# Patient Record
Sex: Male | Born: 1950 | Race: Black or African American | Hispanic: No | Marital: Married | Smoking: Never smoker
Health system: Southern US, Community
[De-identification: ages and names within clinical notes are randomized; demographics above are authoritative.]

## PROBLEM LIST (undated history)

## (undated) DIAGNOSIS — I2510 Atherosclerotic heart disease of native coronary artery without angina pectoris: Secondary | ICD-10-CM

## (undated) DIAGNOSIS — I10 Essential (primary) hypertension: Secondary | ICD-10-CM

## (undated) DIAGNOSIS — E785 Hyperlipidemia, unspecified: Secondary | ICD-10-CM

## (undated) DIAGNOSIS — I214 Non-ST elevation (NSTEMI) myocardial infarction: Secondary | ICD-10-CM

## (undated) HISTORY — DX: Non-ST elevation (NSTEMI) myocardial infarction: I21.4

## (undated) HISTORY — DX: Essential (primary) hypertension: I10

## (undated) HISTORY — DX: Atherosclerotic heart disease of native coronary artery without angina pectoris: I25.10

## (undated) HISTORY — DX: Hyperlipidemia, unspecified: E78.5

---

## 1997-06-24 ENCOUNTER — Other Ambulatory Visit: Admission: RE | Admit: 1997-06-24 | Discharge: 1997-06-24 | Payer: Self-pay | Admitting: *Deleted

## 2000-03-26 ENCOUNTER — Ambulatory Visit (HOSPITAL_COMMUNITY): Admission: RE | Admit: 2000-03-26 | Discharge: 2000-03-26 | Payer: Self-pay | Admitting: Gastroenterology

## 2005-12-17 ENCOUNTER — Emergency Department (HOSPITAL_COMMUNITY): Admission: EM | Admit: 2005-12-17 | Discharge: 2005-12-18 | Payer: Self-pay | Admitting: Emergency Medicine

## 2014-10-09 ENCOUNTER — Emergency Department (HOSPITAL_COMMUNITY)
Admission: EM | Admit: 2014-10-09 | Discharge: 2014-10-10 | Disposition: A | Discharge status: Home / Self Care | Payer: Self-pay | Attending: Emergency Medicine | Admitting: Emergency Medicine

## 2014-10-09 ENCOUNTER — Encounter (HOSPITAL_COMMUNITY): Payer: Self-pay | Admitting: Emergency Medicine

## 2014-10-09 DIAGNOSIS — Y9389 Activity, other specified: Secondary | ICD-10-CM | POA: Insufficient documentation

## 2014-10-09 DIAGNOSIS — S199XXA Unspecified injury of neck, initial encounter: Secondary | ICD-10-CM | POA: Insufficient documentation

## 2014-10-09 DIAGNOSIS — Y92410 Unspecified street and highway as the place of occurrence of the external cause: Secondary | ICD-10-CM | POA: Insufficient documentation

## 2014-10-09 DIAGNOSIS — Y999 Unspecified external cause status: Secondary | ICD-10-CM | POA: Insufficient documentation

## 2014-10-09 DIAGNOSIS — M542 Cervicalgia: Secondary | ICD-10-CM

## 2014-10-09 DIAGNOSIS — I10 Essential (primary) hypertension: Secondary | ICD-10-CM | POA: Insufficient documentation

## 2014-10-09 HISTORY — DX: Essential (primary) hypertension: I10
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
## 2014-10-10 ENCOUNTER — Emergency Department (HOSPITAL_COMMUNITY): Payer: Self-pay

## 2014-10-10 MED ORDER — IBUPROFEN 600 MG PO TABS: 600.0000 mg | ORAL_TABLET | Freq: Three times a day (TID) | ORAL | Status: DC | PRN

## 2014-10-10 MED ORDER — IBUPROFEN 400 MG PO TABS
600.0000 mg | ORAL_TABLET | Freq: Four times a day (QID) | ORAL | Status: DC | PRN
  Administered 2014-10-10: 600 mg via ORAL
  Filled 2014-10-10 (×2): qty 1

## 2014-10-10 MED ORDER — CYCLOBENZAPRINE HCL 10 MG PO TABS: 10.0000 mg | ORAL_TABLET | Freq: Three times a day (TID) | ORAL | Status: DC | PRN

## 2014-10-10 NOTE — ED Notes (Signed)
Pt. Ambulated with no cp/sob/dizziness. Pt states 4/10 pain in the back of neck. No other pain noted. C-collar removed.
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
## 2015-12-29 ENCOUNTER — Other Ambulatory Visit: Payer: Self-pay | Admitting: Family Medicine

## 2015-12-29 ENCOUNTER — Ambulatory Visit
Admission: RE | Admit: 2015-12-29 | Discharge: 2015-12-29 | Disposition: A | Discharge status: Home / Self Care | Payer: 59 | Source: Ambulatory Visit | Attending: Family Medicine | Admitting: Family Medicine

## 2015-12-29 DIAGNOSIS — M542 Cervicalgia: Secondary | ICD-10-CM

## 2015-12-29 DIAGNOSIS — M549 Dorsalgia, unspecified: Secondary | ICD-10-CM

## 2016-11-03 ENCOUNTER — Emergency Department (HOSPITAL_COMMUNITY): Payer: Commercial Managed Care - PPO

## 2016-11-03 ENCOUNTER — Encounter (HOSPITAL_COMMUNITY): Payer: Self-pay

## 2016-11-03 ENCOUNTER — Inpatient Hospital Stay (HOSPITAL_COMMUNITY)
Admission: EM | Admit: 2016-11-03 | Discharge: 2016-11-06 | DRG: 247 | Disposition: A | Discharge status: Home / Self Care | Payer: Commercial Managed Care - PPO | Attending: Cardiovascular Disease | Admitting: Cardiovascular Disease

## 2016-11-03 ENCOUNTER — Encounter (HOSPITAL_COMMUNITY)
Admission: EM | Disposition: A | Discharge status: Home / Self Care | Payer: Self-pay | Source: Home / Self Care | Attending: Cardiovascular Disease

## 2016-11-03 ENCOUNTER — Other Ambulatory Visit: Payer: Self-pay

## 2016-11-03 DIAGNOSIS — Z955 Presence of coronary angioplasty implant and graft: Secondary | ICD-10-CM

## 2016-11-03 DIAGNOSIS — R001 Bradycardia, unspecified: Secondary | ICD-10-CM | POA: Diagnosis present

## 2016-11-03 DIAGNOSIS — I351 Nonrheumatic aortic (valve) insufficiency: Secondary | ICD-10-CM | POA: Diagnosis not present

## 2016-11-03 DIAGNOSIS — I252 Old myocardial infarction: Secondary | ICD-10-CM | POA: Diagnosis present

## 2016-11-03 DIAGNOSIS — I25119 Atherosclerotic heart disease of native coronary artery with unspecified angina pectoris: Secondary | ICD-10-CM

## 2016-11-03 DIAGNOSIS — I2582 Chronic total occlusion of coronary artery: Secondary | ICD-10-CM | POA: Diagnosis present

## 2016-11-03 DIAGNOSIS — I10 Essential (primary) hypertension: Secondary | ICD-10-CM | POA: Diagnosis present

## 2016-11-03 DIAGNOSIS — I2510 Atherosclerotic heart disease of native coronary artery without angina pectoris: Secondary | ICD-10-CM | POA: Diagnosis present

## 2016-11-03 DIAGNOSIS — I214 Non-ST elevation (NSTEMI) myocardial infarction: Secondary | ICD-10-CM

## 2016-11-03 DIAGNOSIS — E785 Hyperlipidemia, unspecified: Secondary | ICD-10-CM | POA: Diagnosis present

## 2016-11-03 DIAGNOSIS — Z7982 Long term (current) use of aspirin: Secondary | ICD-10-CM | POA: Diagnosis not present

## 2016-11-03 HISTORY — PX: LEFT HEART CATH AND CORONARY ANGIOGRAPHY: CATH118249

## 2016-11-03 HISTORY — DX: Non-ST elevation (NSTEMI) myocardial infarction: I21.4

## 2016-11-03 HISTORY — PX: CORONARY STENT INTERVENTION: CATH118234

## 2016-11-03 LAB — CBC
HCT: 36.6 % — ABNORMAL LOW (ref 39.0–52.0)
HCT: 38.3 % — ABNORMAL LOW (ref 39.0–52.0)
HEMOGLOBIN: 13.2 g/dL (ref 13.0–17.0)
Hemoglobin: 12.4 g/dL — ABNORMAL LOW (ref 13.0–17.0)
MCH: 29.2 pg (ref 26.0–34.0)
MCH: 29.4 pg (ref 26.0–34.0)
MCHC: 33.9 g/dL (ref 30.0–36.0)
MCHC: 34.5 g/dL (ref 30.0–36.0)
MCV: 85.3 fL (ref 78.0–100.0)
MCV: 86.1 fL (ref 78.0–100.0)
PLATELETS: 138 10*3/uL — AB (ref 150–400)
Platelets: 150 10*3/uL (ref 150–400)
RBC: 4.25 MIL/uL (ref 4.22–5.81)
RBC: 4.49 MIL/uL (ref 4.22–5.81)
RDW: 13.4 % (ref 11.5–15.5)
RDW: 13.9 % (ref 11.5–15.5)
WBC: 5.2 10*3/uL (ref 4.0–10.5)
WBC: 5.5 10*3/uL (ref 4.0–10.5)

## 2016-11-03 LAB — BASIC METABOLIC PANEL
ANION GAP: 6 (ref 5–15)
BUN: 16 mg/dL (ref 6–20)
CALCIUM: 9.3 mg/dL (ref 8.9–10.3)
CO2: 27 mmol/L (ref 22–32)
Chloride: 103 mmol/L (ref 101–111)
Creatinine, Ser: 1.24 mg/dL (ref 0.61–1.24)
GFR calc Af Amer: >60 mL/min (ref >60)
GFR, EST NON AFRICAN AMERICAN: 59 mL/min — AB (ref >60)
GLUCOSE: 72 mg/dL (ref 65–99)
POTASSIUM: 3.6 mmol/L (ref 3.5–5.1)
SODIUM: 136 mmol/L (ref 135–145)

## 2016-11-03 LAB — I-STAT TROPONIN, ED
TROPONIN I, POC: 0.05 ng/mL (ref 0.00–0.08)
TROPONIN I, POC: 0.46 ng/mL — AB (ref 0.00–0.08)

## 2016-11-03 LAB — TROPONIN I: Troponin I: 55.59 ng/mL (ref <0.03)

## 2016-11-03 LAB — MRSA PCR SCREENING: MRSA by PCR: NEGATIVE

## 2016-11-03 SURGERY — LEFT HEART CATH AND CORONARY ANGIOGRAPHY
Anesthesia: LOCAL

## 2016-11-03 MED ORDER — HEPARIN BOLUS VIA INFUSION
4000.0000 [IU] | Freq: Once | INTRAVENOUS | Status: AC
  Administered 2016-11-03: 4000 [IU] via INTRAVENOUS
  Filled 2016-11-03: qty 4000

## 2016-11-03 MED ORDER — SODIUM CHLORIDE 0.9% FLUSH: 3.0000 mL | INTRAVENOUS | Status: DC | PRN

## 2016-11-03 MED ORDER — TICAGRELOR 90 MG PO TABS
ORAL_TABLET | ORAL | Status: AC
  Filled 2016-11-03: qty 2

## 2016-11-03 MED ORDER — ONDANSETRON HCL 4 MG/2ML IJ SOLN: 4.0000 mg | Freq: Four times a day (QID) | INTRAMUSCULAR | Status: DC | PRN

## 2016-11-03 MED ORDER — FENTANYL CITRATE (PF) 100 MCG/2ML IJ SOLN
INTRAMUSCULAR | Status: DC | PRN
  Administered 2016-11-03: 25 ug via INTRAVENOUS

## 2016-11-03 MED ORDER — MORPHINE SULFATE (PF) 4 MG/ML IV SOLN
4.0000 mg | Freq: Once | INTRAVENOUS | Status: AC
  Administered 2016-11-03: 4 mg via INTRAVENOUS
  Filled 2016-11-03: qty 1

## 2016-11-03 MED ORDER — MIDAZOLAM HCL 2 MG/2ML IJ SOLN
INTRAMUSCULAR | Status: DC | PRN
  Administered 2016-11-03: 2 mg via INTRAVENOUS
  Administered 2016-11-03: 1 mg via INTRAVENOUS

## 2016-11-03 MED ORDER — NITROGLYCERIN 1 MG/10 ML FOR IR/CATH LAB
INTRA_ARTERIAL | Status: AC
  Filled 2016-11-03: qty 10

## 2016-11-03 MED ORDER — IOPAMIDOL (ISOVUE-370) INJECTION 76%
INTRAVENOUS | Status: DC | PRN
  Administered 2016-11-03: 295 mL via INTRA_ARTERIAL

## 2016-11-03 MED ORDER — METOPROLOL TARTRATE 12.5 MG HALF TABLET
12.5000 mg | ORAL_TABLET | Freq: Two times a day (BID) | ORAL | Status: DC
Start: 2016-11-03 — End: 2016-11-03

## 2016-11-03 MED ORDER — VERAPAMIL HCL 2.5 MG/ML IV SOLN
INTRAVENOUS | Status: DC | PRN
  Administered 2016-11-03: 08:00:00 via INTRA_ARTERIAL

## 2016-11-03 MED ORDER — IOPAMIDOL (ISOVUE-370) INJECTION 76%
INTRAVENOUS | Status: AC
  Filled 2016-11-03: qty 50

## 2016-11-03 MED ORDER — VERAPAMIL HCL 2.5 MG/ML IV SOLN
INTRAVENOUS | Status: AC
  Filled 2016-11-03: qty 2

## 2016-11-03 MED ORDER — MIDAZOLAM HCL 2 MG/2ML IJ SOLN
INTRAMUSCULAR | Status: AC
  Filled 2016-11-03: qty 2

## 2016-11-03 MED ORDER — ACETAMINOPHEN 325 MG PO TABS
650.0000 mg | ORAL_TABLET | ORAL | Status: DC | PRN
  Filled 2016-11-03: qty 2

## 2016-11-03 MED ORDER — LABETALOL HCL 5 MG/ML IV SOLN: 10.0000 mg | INTRAVENOUS | Status: AC | PRN

## 2016-11-03 MED ORDER — SODIUM CHLORIDE 0.9 % WEIGHT BASED INFUSION
1.0000 mL/kg/h | INTRAVENOUS | Status: AC
  Administered 2016-11-03: 1 mL/kg/h via INTRAVENOUS

## 2016-11-03 MED ORDER — OXYCODONE HCL 5 MG PO TABS
5.0000 mg | ORAL_TABLET | ORAL | Status: DC | PRN
  Filled 2016-11-03: qty 1

## 2016-11-03 MED ORDER — HEPARIN (PORCINE) IN NACL 2-0.9 UNIT/ML-% IJ SOLN
INTRAMUSCULAR | Status: AC | PRN
  Administered 2016-11-03: 1000 mL

## 2016-11-03 MED ORDER — TICAGRELOR 90 MG PO TABS
ORAL_TABLET | ORAL | Status: DC | PRN
  Administered 2016-11-03: 180 mg via ORAL

## 2016-11-03 MED ORDER — HEPARIN SODIUM (PORCINE) 1000 UNIT/ML IJ SOLN
INTRAMUSCULAR | Status: DC | PRN
  Administered 2016-11-03 (×2): 3000 [IU] via INTRAVENOUS
  Administered 2016-11-03: 4000 [IU] via INTRAVENOUS
  Administered 2016-11-03: 8000 [IU] via INTRAVENOUS

## 2016-11-03 MED ORDER — HEPARIN (PORCINE) IN NACL 2-0.9 UNIT/ML-% IJ SOLN
INTRAMUSCULAR | Status: AC
Start: 2016-11-03 — End: 2016-11-03
  Filled 2016-11-03: qty 500

## 2016-11-03 MED ORDER — HEPARIN SODIUM (PORCINE) 1000 UNIT/ML IJ SOLN
INTRAMUSCULAR | Status: AC
  Filled 2016-11-03: qty 1

## 2016-11-03 MED ORDER — NITROGLYCERIN IN D5W 200-5 MCG/ML-% IV SOLN
0.0000 ug/min | Freq: Once | INTRAVENOUS | Status: AC
  Administered 2016-11-03: 5 ug/min via INTRAVENOUS
  Filled 2016-11-03: qty 250

## 2016-11-03 MED ORDER — LIDOCAINE HCL (PF) 1 % IJ SOLN
INTRAMUSCULAR | Status: AC
  Filled 2016-11-03: qty 30

## 2016-11-03 MED ORDER — HEPARIN (PORCINE) IN NACL 100-0.45 UNIT/ML-% IJ SOLN
1000.0000 [IU]/h | INTRAMUSCULAR | Status: DC
  Administered 2016-11-03: 1000 [IU]/h via INTRAVENOUS
  Filled 2016-11-03: qty 250

## 2016-11-03 MED ORDER — ASPIRIN EC 81 MG PO TBEC
81.0000 mg | DELAYED_RELEASE_TABLET | Freq: Every day | ORAL | Status: DC
  Administered 2016-11-04: 81 mg via ORAL
  Filled 2016-11-03: qty 1

## 2016-11-03 MED ORDER — IOPAMIDOL (ISOVUE-370) INJECTION 76%
INTRAVENOUS | Status: AC
  Filled 2016-11-03: qty 100

## 2016-11-03 MED ORDER — METOPROLOL TARTRATE 12.5 MG HALF TABLET
12.5000 mg | ORAL_TABLET | Freq: Two times a day (BID) | ORAL | Status: DC
  Administered 2016-11-04 – 2016-11-06 (×5): 12.5 mg via ORAL
  Filled 2016-11-03 (×5): qty 1

## 2016-11-03 MED ORDER — ASPIRIN 81 MG PO CHEW
324.0000 mg | CHEWABLE_TABLET | Freq: Once | ORAL | Status: AC
  Administered 2016-11-03: 324 mg via ORAL
  Filled 2016-11-03: qty 4

## 2016-11-03 MED ORDER — SODIUM CHLORIDE 0.9 % IV SOLN: 250.0000 mL | INTRAVENOUS | Status: DC | PRN

## 2016-11-03 MED ORDER — IOPAMIDOL (ISOVUE-370) INJECTION 76%
INTRAVENOUS | Status: AC
  Filled 2016-11-03: qty 125

## 2016-11-03 MED ORDER — NITROGLYCERIN 0.4 MG SL SUBL
0.4000 mg | SUBLINGUAL_TABLET | SUBLINGUAL | Status: DC | PRN
  Administered 2016-11-03: 0.4 mg via SUBLINGUAL
  Filled 2016-11-03: qty 1

## 2016-11-03 MED ORDER — LIDOCAINE HCL (PF) 1 % IJ SOLN
INTRAMUSCULAR | Status: DC | PRN
  Administered 2016-11-03: 2 mL

## 2016-11-03 MED ORDER — SODIUM CHLORIDE 0.9% FLUSH
3.0000 mL | Freq: Two times a day (BID) | INTRAVENOUS | Status: DC
  Administered 2016-11-03 – 2016-11-06 (×6): 3 mL via INTRAVENOUS

## 2016-11-03 MED ORDER — CYCLOBENZAPRINE HCL 10 MG PO TABS: 10.0000 mg | ORAL_TABLET | Freq: Three times a day (TID) | ORAL | Status: DC | PRN

## 2016-11-03 MED ORDER — NITROGLYCERIN 1 MG/10 ML FOR IR/CATH LAB
INTRA_ARTERIAL | Status: DC | PRN
  Administered 2016-11-03 (×2): 150 ug via INTRACORONARY
  Administered 2016-11-03: 150 ug

## 2016-11-03 MED ORDER — HEPARIN (PORCINE) IN NACL 2-0.9 UNIT/ML-% IJ SOLN
INTRAMUSCULAR | Status: AC
  Filled 2016-11-03: qty 500

## 2016-11-03 MED ORDER — FENTANYL CITRATE (PF) 100 MCG/2ML IJ SOLN
INTRAMUSCULAR | Status: AC
Start: 2016-11-03 — End: 2016-11-03
  Filled 2016-11-03: qty 2

## 2016-11-03 MED ORDER — TICAGRELOR 90 MG PO TABS
90.0000 mg | ORAL_TABLET | Freq: Two times a day (BID) | ORAL | Status: DC
  Administered 2016-11-03 – 2016-11-06 (×6): 90 mg via ORAL
  Filled 2016-11-03 (×6): qty 1

## 2016-11-03 MED ORDER — HYDRALAZINE HCL 20 MG/ML IJ SOLN: 5.0000 mg | INTRAMUSCULAR | Status: AC | PRN

## 2016-11-03 SURGICAL SUPPLY — 29 items
BALLN SAPPHIRE 2.0X12 (BALLOONS) ×6
BALLN SAPPHIRE ~~LOC~~ 2.25X12 (BALLOONS) ×2 IMPLANT
BALLN SAPPHIRE ~~LOC~~ 2.5X15 (BALLOONS) ×2 IMPLANT
BALLN ~~LOC~~ EMERGE MR 2.25X20 (BALLOONS) ×2
BALLN ~~LOC~~ EMERGE MR 2.5X20 (BALLOONS) ×2
BALLOON SAPPHIRE 2.0X12 (BALLOONS) ×3 IMPLANT
BALLOON ~~LOC~~ EMERGE MR 2.25X20 (BALLOONS) ×1 IMPLANT
BALLOON ~~LOC~~ EMERGE MR 2.5X20 (BALLOONS) ×1 IMPLANT
CATH EXPO 5FR FR4 (CATHETERS) ×2 IMPLANT
CATH INFINITI 5 FR 3DRC (CATHETERS) ×2 IMPLANT
CATH INFINITI 5 FR JL3.5 (CATHETERS) ×2 IMPLANT
CATH LAUNCHER 5F AL1 (CATHETERS) ×1 IMPLANT
CATH LAUNCHER 6FR EBU3.5 (CATHETERS) ×2 IMPLANT
CATH LAUNCHER 6FR JR4 (CATHETERS) ×2 IMPLANT
CATHETER LAUNCHER 5F AL1 (CATHETERS) ×2
DEVICE RAD COMP TR BAND LRG (VASCULAR PRODUCTS) ×2 IMPLANT
ELECT DEFIB PAD ADLT CADENCE (PAD) ×2 IMPLANT
GLIDESHEATH SLEND SS 6F .021 (SHEATH) ×2 IMPLANT
GUIDEWIRE INQWIRE 1.5J.035X260 (WIRE) ×2 IMPLANT
INQWIRE 1.5J .035X260CM (WIRE) ×4
KIT ENCORE 26 ADVANTAGE (KITS) ×2 IMPLANT
KIT HEART LEFT (KITS) ×2 IMPLANT
PACK CARDIAC CATHETERIZATION (CUSTOM PROCEDURE TRAY) ×2 IMPLANT
STENT SYNERGY DES 2.25X20 (Permanent Stent) ×2 IMPLANT
SYR MEDRAD MARK V 150ML (SYRINGE) ×2 IMPLANT
TRANSDUCER W/STOPCOCK (MISCELLANEOUS) ×2 IMPLANT
TUBING CIL FLEX 10 FLL-RA (TUBING) ×2 IMPLANT
WIRE ASAHI GRAND SLAM 180CM (WIRE) ×2 IMPLANT
WIRE COUGAR XT STRL 190CM (WIRE) ×4 IMPLANT
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
## 2016-11-03 NOTE — Interval H&P Note (Signed)
Cath Lab Visit (complete for each Cath Lab visit)  Clinical Evaluation Leading to the Procedure:   ACS: Yes.    Non-ACS:    Anginal Classification: CCS IV  Anti-ischemic medical therapy: Minimal Therapy (1 class of medications)  Non-Invasive Test Results: No non-invasive testing performed  Prior CABG: No previous CABG  Pt seen in ED. 4/10 chest pain overnight and currently. Abnormal but nondiagnotic EKG with mild troponin elevation. Agree urgent cath/PCI indicated. I have reviewed the risks, indications, and alternatives to cardiac catheterization, possible angioplasty, and stenting with the patient. Risks include but are not limited to bleeding, infection, vascular injury, stroke, myocardial infection, arrhythmia, kidney injury, radiation-related injury in the case of prolonged fluoroscopy use, emergency cardiac surgery, and death. The patient understands the risks of serious complication is 1-2 in 1000 with diagnostic cardiac cath and 1-2% or less with angioplasty/stenting.   History and Physical Interval Note:  11/03/2016 7:45 AM  Joshua Kirk  has presented today for surgery, with the diagnosis of STEMI  The various methods of treatment have been discussed with the patient and family. After consideration of risks, benefits and other options for treatment, the patient has consented to  Procedure(s): LEFT HEART CATH AND CORONARY ANGIOGRAPHY (N/A) as a surgical intervention .  The patient's history has been reviewed, patient examined, no change in status, stable for surgery.  I have reviewed the patient's chart and labs.  Questions were answered to the patient's satisfaction.     Tonny Bollman

## 2016-11-03 NOTE — ED Notes (Signed)
INTERVENTIONAL CARDIOLOGY  Asked to review clinical scenario by DR. Campos.: 66 yo with onset of central chest pain while dancing ~11:30 PM. Initial troponin was negative. IV morphine was given with reduction in pain. Delta troponin elevated at .46. Mild persisting pain with ECG showing STE V2 with inferior TWI without evolution over 5 hours. Hemodynamically stable. CXR with cardiomegaly.  Impression is ACS.  Plan escalation of GDMT including IV heparin, aspirin, IV NTG, high intensity statin and if unable to cool down will need coronary angiography. Please get general cardiology to see ASAP.

## 2016-11-03 NOTE — Progress Notes (Signed)
Patient developed golf ball size hematoma right radial forearm. Band inflated to original 13 cc's. Pressure held 15 minutes. Hematoma gone, now level 0. Will continue to monitor closely. VSS.   Delories Heinz, RN
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
## 2016-11-03 NOTE — Progress Notes (Signed)
ANTICOAGULATION CONSULT NOTE - Initial Consult  Pharmacy Consult for heparin Indication: chest pain/ACS  No Known Allergies  Patient Measurements: Height: 5\' 8"  (172.7 cm) Weight: 185 lb (83.9 kg) IBW/kg (Calculated) : 68.4  Vital Signs: Temp: 98.3 F (36.8 C) (08/26 0043) Temp Source: Oral (08/26 0043) BP: 135/86 (08/26 0445) Pulse Rate: 63 (08/26 0445)  Labs:  Recent Labs  11/03/16 0043  HGB 13.2  HCT 38.3*  PLT 150  CREATININE 1.24    Estimated Creatinine Clearance: 61.8 mL/min (by C-G formula based on SCr of 1.24 mg/dL).   Medical History: Past Medical History:  Diagnosis Date  . Hypertension     Assessment: 66yo male c/o central CP w/ exertion, wife reports that pt has been fatigued lately, istat troponin elevated, to begin heparin.  Goal of Therapy:  Heparin level 0.3-0.7 units/ml Monitor platelets by anticoagulation protocol: Yes   Plan:  Will give heparin 4000 units IV bolus x1 followed by gtt at 1000 units/hr and monitor heparin levels and CBC.  Joshua Kirk, PharmD, BCPS  11/03/2016,5:31 AM

## 2016-11-03 NOTE — ED Notes (Signed)
Heparin verified by [facility] RN

## 2016-11-03 NOTE — ED Provider Notes (Signed)
[facility]-EMERGENCY DEPT Provider Note   CSN: [id number] Arrival date & time: 11/03/16  0034     History   Chief Complaint Chief Complaint  Patient presents with  . Chest Pain    HPI Joshua Kirk is a 66 y.o. male.  Patient presents to the emergency department with chief complaint of chest pain. He states pain started at around 10:30 PM. He describes it as central. It does not radiate. He denies any diaphoresis, nausea, or shortness breath. Denies any fevers, chills, or cough. Denies any injury. There are no other associated symptoms. There are no modifying factors. He has taken 4 baby aspirin for his symptoms with no relief. He rates his pain is 8 out of 10. He denies any prior history of ACS, PE, or DVT.   The history is provided by the patient. No language interpreter was used.    Past Medical History:  Diagnosis Date  . Hypertension     There are no active problems to display for this patient.   History reviewed. No pertinent surgical history.     Home Medications    Prior to Admission medications   Medication Sig Start Date End Date Taking? Authorizing Provider  amLODipine (NORVASC) 10 MG tablet Take 10 mg by mouth daily.    [provider]  aspirin EC 81 MG tablet Take 81 mg by mouth daily.    [provider]  cyclobenzaprine (FLEXERIL) 10 MG tablet Take 1 tablet (10 mg total) by mouth 3 (three) times daily as needed for muscle spasms. 10/10/14   Azalia Bilis, MD  ibuprofen (ADVIL,MOTRIN) 600 MG tablet Take 1 tablet (600 mg total) by mouth every 8 (eight) hours as needed. 10/10/14   Azalia Bilis, MD    Family History History reviewed. No pertinent family history.  Social History Social History  Substance Use Topics  . Smoking status: Never Smoker  . Smokeless tobacco: Never Used  . Alcohol use Yes     Comment: Occasionally     Allergies   Patient has no known allergies.   Review of Systems Review of Systems  All other systems  reviewed and are negative.    Physical Exam Updated Vital Signs Temp 98.3 F (36.8 C) (Oral)   Ht 5\' 8"  (1.727 m)   Wt 83.9 kg (185 lb)   BMI 28.13 kg/m   Physical Exam  Constitutional: He is oriented to person, place, and time. He appears well-developed and well-nourished.  HENT:  Head: Normocephalic and atraumatic.  Eyes: Pupils are equal, round, and reactive to light. Conjunctivae and EOM are normal. Right eye exhibits no discharge. Left eye exhibits no discharge. No scleral icterus.  Neck: Normal range of motion. Neck supple. No JVD present.  Cardiovascular: Normal rate, regular rhythm and normal heart sounds.  Exam reveals no gallop and no friction rub.   No murmur heard. Pulmonary/Chest: Effort normal and breath sounds normal. No respiratory distress. He has no wheezes. He has no rales. He exhibits no tenderness.  Abdominal: Soft. He exhibits no distension and no mass. There is no tenderness. There is no rebound and no guarding.  Musculoskeletal: Normal range of motion. He exhibits no edema or tenderness.  Neurological: He is alert and oriented to person, place, and time.  Skin: Skin is warm and dry.  Psychiatric: He has a normal mood and affect. His behavior is normal. Judgment and thought content normal.  Nursing note and vitals reviewed.    ED Treatments / Results  Labs (all  labs ordered are listed, but only abnormal results are displayed) Labs Reviewed  BASIC METABOLIC PANEL - Abnormal; Notable for the following:       Result Value   GFR calc non Af Amer 59 (*)    All other components within normal limits  CBC - Abnormal; Notable for the following:    HCT 38.3 (*)    All other components within normal limits  I-STAT TROPONIN, ED    EKG  EKG Interpretation None       Radiology Dg Chest 2 View  Result Date: 11/03/2016 CLINICAL DATA:  Central chest pain. EXAM: CHEST  2 VIEW COMPARISON:  None. FINDINGS: Mild-to-moderate cardiomegaly. Aortic tortuosity. No  pulmonary edema. No confluent airspace disease, pleural fluid or pneumothorax. No acute osseous abnormality. IMPRESSION: Cardiomegaly with tortuous thoracic aorta. No acute pulmonary process. Electronically Signed   By: Rubye Oaks M.D.   On: 11/03/2016 01:36    Procedures Procedures (including critical care time) CRITICAL CARE Performed by: Roxy Horseman   Total critical care time: 39 minutes  Critical care time was exclusive of separately billable procedures and treating other patients.  Critical care was necessary to treat or prevent imminent or life-threatening deterioration.  Critical care was time spent personally by me on the following activities: development of treatment plan with patient and/or surrogate as well as nursing, discussions with consultants, evaluation of patient's response to treatment, examination of patient, obtaining history from patient or surrogate, ordering and performing treatments and interventions, ordering and review of laboratory studies, ordering and review of radiographic studies, pulse oximetry and re-evaluation of patient's condition.  Medications Ordered in ED Medications  morphine 4 MG/ML injection 4 mg (not administered)     Initial Impression / Assessment and Plan / ED Course  I have reviewed the triage vital signs and the nursing notes.  Pertinent labs & imaging results that were available during my care of the patient were reviewed by me and considered in my medical decision making (see chart for details).     Patient with chest pain. Will check labs, will reassess.  Basic labs are reassuring. No ischemic changes on EKG. Will check delta troponin. We'll treat pain with morphine.  Patient reports good improvement with morphine.  Repeat troponin is 0.46.  Patient states that pain is returning.   6:04 AM Patient discussed with Dr. Garnette Scheuermann, who states that patient has some ischemic changes, but doesn't think he has to go to the  cath lab right now.  Recommends adding nitro.  6:05 AM Second consult attempted to on-call cardiologist.  Appreciate cardiology for admitting the patient.  Final Clinical Impressions(s) / ED Diagnoses   Final diagnoses:  NSTEMI (non-ST elevated myocardial infarction) [facility])    New Prescriptions New Prescriptions   No medications on file     Felipa Furnace 11/03/16 1610    Azalia Bilis, MD 11/03/16 [id number]

## 2016-11-04 ENCOUNTER — Encounter (HOSPITAL_COMMUNITY): Payer: Self-pay | Admitting: Cardiovascular Disease

## 2016-11-04 ENCOUNTER — Inpatient Hospital Stay (HOSPITAL_COMMUNITY): Payer: Commercial Managed Care - PPO

## 2016-11-04 DIAGNOSIS — I10 Essential (primary) hypertension: Secondary | ICD-10-CM

## 2016-11-04 DIAGNOSIS — I214 Non-ST elevation (NSTEMI) myocardial infarction: Principal | ICD-10-CM

## 2016-11-04 DIAGNOSIS — I351 Nonrheumatic aortic (valve) insufficiency: Secondary | ICD-10-CM

## 2016-11-04 DIAGNOSIS — E785 Hyperlipidemia, unspecified: Secondary | ICD-10-CM

## 2016-11-04 LAB — CBC
HEMATOCRIT: 38.5 % — AB (ref 39.0–52.0)
Hemoglobin: 13.1 g/dL (ref 13.0–17.0)
MCH: 29.6 pg (ref 26.0–34.0)
MCHC: 34 g/dL (ref 30.0–36.0)
MCV: 86.9 fL (ref 78.0–100.0)
PLATELETS: 140 10*3/uL — AB (ref 150–400)
RBC: 4.43 MIL/uL (ref 4.22–5.81)
RDW: 14.2 % (ref 11.5–15.5)
WBC: 4.6 10*3/uL (ref 4.0–10.5)

## 2016-11-04 LAB — BASIC METABOLIC PANEL
Anion gap: 5 (ref 5–15)
BUN: 11 mg/dL (ref 6–20)
CHLORIDE: 106 mmol/L (ref 101–111)
CO2: 28 mmol/L (ref 22–32)
CREATININE: 0.93 mg/dL (ref 0.61–1.24)
Calcium: 8.9 mg/dL (ref 8.9–10.3)
GFR calc Af Amer: >60 mL/min (ref >60)
GFR calc non Af Amer: >60 mL/min (ref >60)
GLUCOSE: 111 mg/dL — AB (ref 65–99)
POTASSIUM: 3.6 mmol/L (ref 3.5–5.1)
Sodium: 139 mmol/L (ref 135–145)

## 2016-11-04 LAB — POCT ACTIVATED CLOTTING TIME
ACTIVATED CLOTTING TIME: 246 s
ACTIVATED CLOTTING TIME: 274 s
ACTIVATED CLOTTING TIME: 263 s
Activated Clotting Time: 301 seconds

## 2016-11-04 LAB — ECHOCARDIOGRAM COMPLETE
HEIGHTINCHES: 68 in
WEIGHTICAEL: 2980.62 [oz_av]

## 2016-11-04 LAB — TROPONIN I: Troponin I: 20.25 ng/mL (ref <0.03)

## 2016-11-04 MED ORDER — AMLODIPINE BESYLATE 5 MG PO TABS
5.0000 mg | ORAL_TABLET | Freq: Every day | ORAL | Status: DC
  Administered 2016-11-04 – 2016-11-06 (×3): 5 mg via ORAL
  Filled 2016-11-04 (×3): qty 1

## 2016-11-04 MED ORDER — ATORVASTATIN CALCIUM 80 MG PO TABS
80.0000 mg | ORAL_TABLET | Freq: Every day | ORAL | Status: DC
  Administered 2016-11-04 – 2016-11-05 (×2): 80 mg via ORAL
  Filled 2016-11-04 (×2): qty 1

## 2016-11-04 NOTE — Progress Notes (Signed)
Progress Note  Patient Name: Joshua Kirk Date of Encounter: 11/04/2016  Primary Cardiologist: new Dr Katrinka Blazing  Subjective   Day 1 s/p LCX MI; no recurrent chest pain  Inpatient Medications    Scheduled Meds: . aspirin EC  81 mg Oral Daily  . metoprolol tartrate  12.5 mg Oral BID  . sodium chloride flush  3 mL Intravenous Q12H  . ticagrelor  90 mg Oral BID   Continuous Infusions: . sodium chloride     PRN Meds: sodium chloride, acetaminophen, cyclobenzaprine, nitroGLYCERIN, ondansetron (ZOFRAN) IV, oxyCODONE, sodium chloride flush   Vital Signs    Vitals:   11/04/16 0400 11/04/16 0500 11/04/16 0600 11/04/16 0700  BP: (!) 171/79 (!) 155/75 (!) 145/99 129/78  Pulse: (!) 57 (!) 57 (!) 59 (!) 56  Resp: 14 16 18 13   Temp:      TempSrc:      SpO2: 94% 97% 98% 97%  Weight:   186 lb 4.6 oz (84.5 kg)   Height:        Intake/Output Summary (Last 24 hours) at 11/04/16 0806 Last data filed at 11/04/16 0600  Gross per 24 hour  Intake                0 ml  Output             3050 ml  Net            -3050 ml    I/O since admission: -3050  Filed Weights   11/03/16 0044 11/04/16 0600  Weight: 185 lb (83.9 kg) 186 lb 4.6 oz (84.5 kg)    Telemetry    Sinus Bradycardia at 58 - Personally Reviewed  ECG    ECG (independently read by me): SB at 54; 1 mm STE V2, 0.5 mm V3; T wave inversion 3,aVF.  Physical Exam   BP 129/78   Pulse (!) 56   Temp 98.8 F (37.1 C) (Oral)   Resp 13   Ht 5\' 8"  (1.727 m)   Wt 186 lb 4.6 oz (84.5 kg)   SpO2 97%   BMI 28.33 kg/m  General: Alert, oriented, no distress.  Skin: normal turgor, no rashes, warm and dry HEENT: Normocephalic, atraumatic. Pupils equal round and reactive to light; sclera anicteric; extraocular muscles intact;  Nose without nasal septal hypertrophy Mouth/Parynx benign; Mallinpatti scale 3/4 Neck: No JVD, no carotid bruits; normal carotid upstroke Lungs: clear to ausculatation and percussion; no wheezing or  rales Chest wall: without tenderness to palpitation Heart: PMI not displaced, RRR, s1 s2 normal, 1/6 systolic murmur, no diastolic murmur, no rubs, gallops, thrills, or heaves Abdomen: soft, nontender; no hepatosplenomehaly, BS+; abdominal aorta nontender and not dilated by palpation. Back: no CVA tenderness Pulses 2+; R radial cath site stable Musculoskeletal: full range of motion, normal strength, no joint deformities Extremities: no clubbing cyanosis or edema, Homan's sign negative  Neurologic: grossly nonfocal; Cranial nerves grossly wnl Psychologic: Normal mood and affect   Labs    Chemistry Recent Labs Lab 11/03/16 0043 11/04/16 0401  NA 136 139  K 3.6 3.6  CL 103 106  CO2 27 28  GLUCOSE 72 111*  BUN 16 11  CREATININE 1.24 0.93  CALCIUM 9.3 8.9  GFRNONAA 59* >60  GFRAA >60 >60  ANIONGAP 6 5     Hematology Recent Labs Lab 11/03/16 0043 11/03/16 0533 11/04/16 0401  WBC 5.5 5.2 4.6  RBC 4.49 4.25 4.43  HGB 13.2 12.4* 13.1  HCT 38.3* 36.6*  38.5*  MCV 85.3 86.1 86.9  MCH 29.4 29.2 29.6  MCHC 34.5 33.9 34.0  RDW 13.4 13.9 14.2  PLT 150 138* 140*    Cardiac Enzymes Recent Labs Lab 11/03/16 1803 11/04/16 0401  TROPONINI 55.59* 20.25*    Recent Labs Lab 11/03/16 0057 11/03/16 0516  TROPIPOC 0.05 0.46*     BNPNo results for input(s): BNP, PROBNP in the last 168 hours.   DDimer No results for input(s): DDIMER in the last 168 hours.  Lipid Panel  No results found for: CHOL, TRIG, HDL, CHOLHDL, VLDL, LDLCALC, LDLDIRECT   Radiology    Dg Chest 2 View  Result Date: 11/03/2016 CLINICAL DATA:  Central chest pain. EXAM: CHEST  2 VIEW COMPARISON:  None. FINDINGS: Mild-to-moderate cardiomegaly. Aortic tortuosity. No pulmonary edema. No confluent airspace disease, pleural fluid or pneumothorax. No acute osseous abnormality. IMPRESSION: Cardiomegaly with tortuous thoracic aorta. No acute pulmonary process. Electronically Signed   By: Rubye Oaks M.D.    On: 11/03/2016 01:36    Cardiac Studies   Conclusion   1. Non-STEMI secondary to total occlusion of the left circumflex, treated with PCI using a 2.25 x 20 mm Synergy DES. PCI procedure is complex related to a fibrocalcific lesion that was difficult to cross with a stent. The procedure required prolonged fluoroscopy, multiple balloon predilatation's, a buddy wire technique, and angioplasty of different subbranches. 2. Residual severe stenosis in the proximal to mid LAD at the level of the first septal perforator 3. Widely patent RCA, subselectively injected 4. Normal LVEDP 5. Left ventriculography is deferred because of high contrast volume  Recommendations:   Dual antiplatelet therapy with aspirin and brilinta 12 months minimum.  Staged PCI of the LAD prior to discharge, possibly tomorrow if patient stable and renal function is within limits  Assessment of LV function with echo  Initiate goal-directed medical therapy with a beta blocker and high intensity statin drug          Post-Intervention Diagram     Patient Profile     66 y.o. male who developed new onset chest pain late on 8/25 which persisted throughout the night and underwentt PCI of totally occluded LCX.   Assessment & Plan    1. ACS due to total LCX occlusion ; difficult but successful PCI to LCS.  Peak troponin 55.59. No recurrent chest pain. Will add ACE-I post MI, but not today with plans for staged PCI to LAD with contrast concerns on renal fxn.  Schedule  echo today to assess LV Fxn.   2. Concomitant CAD; plan for staged PCI to LAD.  With difficult procedure yesterday hydrate today and tentatively plan for tomorrow. Cr 1.24 . 0.93.   3. HLD: lipids pending; will add atorvastatin 80 mg post ACS.  4. HTN:  Will resume amlopidine at 5 mg today  5. Bradycardia: post MI on low dose metoprolol.   Signed, Lennette Bihari, MD, [facility] 11/04/2016, 8:06 AM

## 2016-11-04 NOTE — Progress Notes (Signed)
CARDIAC REHAB PHASE I   PRE:  Rate/Rhythm: 54 SB    BP: sitting 154/87    SaO2:   MODE:  Ambulation: 370 ft   POST:  Rate/Rhythm: 70 SR    BP: sitting 160/94     SaO2:   Tolerated well, BP elevated. He sts this is unusual for him. Began ed with pt and wife. Good reception. She is a NP at [employer]. Will refer to [place] [facility]. Discussed Brilinta, he needs CM c/s. [id number]   Harriet Masson CES, ACSM 11/04/2016 12:21 PM

## 2016-11-04 NOTE — Progress Notes (Signed)
CRITICAL VALUE ALERT  Critical Value:  Trop 55.59 Date & Time Notied: 8/26 2200   Provider Notified: Dr Tiburcio Pea  Orders Received/Actions taken: no

## 2016-11-04 NOTE — Progress Notes (Signed)
  Echocardiogram 2D Echocardiogram has been performed.  Janalyn Harder 11/04/2016, 3:43 PM

## 2016-11-05 ENCOUNTER — Other Ambulatory Visit: Payer: Self-pay

## 2016-11-05 ENCOUNTER — Encounter (HOSPITAL_COMMUNITY)
Admission: EM | Disposition: A | Discharge status: Home / Self Care | Payer: Self-pay | Source: Home / Self Care | Attending: Cardiovascular Disease

## 2016-11-05 DIAGNOSIS — I25119 Atherosclerotic heart disease of native coronary artery with unspecified angina pectoris: Secondary | ICD-10-CM

## 2016-11-05 HISTORY — PX: CORONARY STENT INTERVENTION: CATH118234

## 2016-11-05 LAB — LIPID PANEL
Cholesterol: 171 mg/dL (ref 0–200)
HDL: 41 mg/dL (ref >40)
LDL CALC: 111 mg/dL — AB (ref 0–99)
Total CHOL/HDL Ratio: 4.2 RATIO
Triglycerides: 95 mg/dL (ref <150)
VLDL: 19 mg/dL (ref 0–40)

## 2016-11-05 LAB — BASIC METABOLIC PANEL
ANION GAP: 7 (ref 5–15)
BUN: 12 mg/dL (ref 6–20)
CALCIUM: 9.2 mg/dL (ref 8.9–10.3)
CO2: 26 mmol/L (ref 22–32)
CREATININE: 0.75 mg/dL (ref 0.61–1.24)
Chloride: 103 mmol/L (ref 101–111)
GLUCOSE: 97 mg/dL (ref 65–99)
Potassium: 3.6 mmol/L (ref 3.5–5.1)
SODIUM: 136 mmol/L (ref 135–145)

## 2016-11-05 LAB — PROTIME-INR
INR: 1.09
PROTHROMBIN TIME: 14.1 s (ref 11.4–15.2)

## 2016-11-05 LAB — POCT ACTIVATED CLOTTING TIME: Activated Clotting Time: 362 seconds

## 2016-11-05 SURGERY — CORONARY STENT INTERVENTION
Anesthesia: LOCAL

## 2016-11-05 MED ORDER — SODIUM CHLORIDE 0.9% FLUSH
3.0000 mL | Freq: Two times a day (BID) | INTRAVENOUS | Status: DC
  Administered 2016-11-05: 3 mL via INTRAVENOUS

## 2016-11-05 MED ORDER — MIDAZOLAM HCL 2 MG/2ML IJ SOLN
INTRAMUSCULAR | Status: DC | PRN
  Administered 2016-11-05: 2 mg via INTRAVENOUS

## 2016-11-05 MED ORDER — HEPARIN (PORCINE) IN NACL 2-0.9 UNIT/ML-% IJ SOLN
INTRAMUSCULAR | Status: AC
  Filled 2016-11-05: qty 500

## 2016-11-05 MED ORDER — ASPIRIN EC 81 MG PO TBEC
81.0000 mg | DELAYED_RELEASE_TABLET | Freq: Every day | ORAL | Status: DC
  Administered 2016-11-06: 81 mg via ORAL
  Filled 2016-11-05: qty 1

## 2016-11-05 MED ORDER — ASPIRIN 81 MG PO CHEW
81.0000 mg | CHEWABLE_TABLET | ORAL | Status: AC
  Administered 2016-11-05: 81 mg via ORAL
  Filled 2016-11-05: qty 1

## 2016-11-05 MED ORDER — SODIUM CHLORIDE 0.9 % IV SOLN
INTRAVENOUS | Status: AC
  Administered 2016-11-05: 18:00:00 via INTRAVENOUS

## 2016-11-05 MED ORDER — VERAPAMIL HCL 2.5 MG/ML IV SOLN
INTRAVENOUS | Status: AC
  Filled 2016-11-05: qty 2

## 2016-11-05 MED ORDER — HEPARIN (PORCINE) IN NACL 2-0.9 UNIT/ML-% IJ SOLN
INTRAMUSCULAR | Status: DC | PRN
  Administered 2016-11-05: 1500 mL

## 2016-11-05 MED ORDER — HYDRALAZINE HCL 20 MG/ML IJ SOLN: 5.0000 mg | INTRAMUSCULAR | Status: AC | PRN

## 2016-11-05 MED ORDER — IOPAMIDOL (ISOVUE-370) INJECTION 76%
INTRAVENOUS | Status: AC
  Filled 2016-11-05: qty 125

## 2016-11-05 MED ORDER — MIDAZOLAM HCL 2 MG/2ML IJ SOLN
INTRAMUSCULAR | Status: AC
  Filled 2016-11-05: qty 2

## 2016-11-05 MED ORDER — HEPARIN SODIUM (PORCINE) 1000 UNIT/ML IJ SOLN
INTRAMUSCULAR | Status: AC
  Filled 2016-11-05: qty 1

## 2016-11-05 MED ORDER — FENTANYL CITRATE (PF) 100 MCG/2ML IJ SOLN
INTRAMUSCULAR | Status: AC
  Filled 2016-11-05: qty 2

## 2016-11-05 MED ORDER — LIDOCAINE HCL (PF) 1 % IJ SOLN
INTRAMUSCULAR | Status: DC | PRN
  Administered 2016-11-05: 3 mL

## 2016-11-05 MED ORDER — SODIUM CHLORIDE 0.9% FLUSH: 3.0000 mL | INTRAVENOUS | Status: DC | PRN

## 2016-11-05 MED ORDER — VERAPAMIL HCL 2.5 MG/ML IV SOLN
INTRAVENOUS | Status: DC | PRN
  Administered 2016-11-05: 10 mL via INTRA_ARTERIAL

## 2016-11-05 MED ORDER — SODIUM CHLORIDE 0.9 % IV SOLN: 250.0000 mL | INTRAVENOUS | Status: DC | PRN

## 2016-11-05 MED ORDER — SODIUM CHLORIDE 0.9% FLUSH
3.0000 mL | Freq: Two times a day (BID) | INTRAVENOUS | Status: DC
  Administered 2016-11-05 – 2016-11-06 (×2): 3 mL via INTRAVENOUS

## 2016-11-05 MED ORDER — SODIUM CHLORIDE 0.9 % WEIGHT BASED INFUSION
3.0000 mL/kg/h | INTRAVENOUS | Status: DC
  Administered 2016-11-05: 3 mL/kg/h via INTRAVENOUS

## 2016-11-05 MED ORDER — IOPAMIDOL (ISOVUE-370) INJECTION 76%
INTRAVENOUS | Status: DC | PRN
  Administered 2016-11-05: 90 mL via INTRA_ARTERIAL

## 2016-11-05 MED ORDER — LABETALOL HCL 5 MG/ML IV SOLN: 10.0000 mg | INTRAVENOUS | Status: AC | PRN

## 2016-11-05 MED ORDER — HEPARIN SODIUM (PORCINE) 1000 UNIT/ML IJ SOLN
INTRAMUSCULAR | Status: DC | PRN
  Administered 2016-11-05: 8000 [IU] via INTRAVENOUS

## 2016-11-05 MED ORDER — NITROGLYCERIN 1 MG/10 ML FOR IR/CATH LAB
INTRA_ARTERIAL | Status: DC | PRN
  Administered 2016-11-05: 200 ug via INTRACORONARY

## 2016-11-05 MED ORDER — LIDOCAINE HCL (PF) 1 % IJ SOLN
INTRAMUSCULAR | Status: AC
  Filled 2016-11-05: qty 30

## 2016-11-05 MED ORDER — FENTANYL CITRATE (PF) 100 MCG/2ML IJ SOLN
INTRAMUSCULAR | Status: DC | PRN
  Administered 2016-11-05: 50 ug via INTRAVENOUS

## 2016-11-05 MED ORDER — SODIUM CHLORIDE 0.9 % WEIGHT BASED INFUSION
1.0000 mL/kg/h | INTRAVENOUS | Status: DC
  Administered 2016-11-05: 1 mL/kg/h via INTRAVENOUS

## 2016-11-05 SURGICAL SUPPLY — 16 items
BALLN EMERGE MR 3.5X8 (BALLOONS) ×2
BALLN SAPPHIRE ~~LOC~~ 4.0X8 (BALLOONS) ×2 IMPLANT
BALLOON EMERGE MR 3.5X8 (BALLOONS) ×1 IMPLANT
CATH LAUNCHER 6FR EBU3.5 (CATHETERS) ×2 IMPLANT
DEVICE RAD COMP TR BAND LRG (VASCULAR PRODUCTS) ×2 IMPLANT
GLIDESHEATH SLEND A-KIT 6F 22G (SHEATH) ×2 IMPLANT
GUIDEWIRE INQWIRE 1.5J.035X260 (WIRE) ×1 IMPLANT
INQWIRE 1.5J .035X260CM (WIRE) ×2
KIT ENCORE 26 ADVANTAGE (KITS) ×4 IMPLANT
KIT HEART LEFT (KITS) ×2 IMPLANT
PACK CARDIAC CATHETERIZATION (CUSTOM PROCEDURE TRAY) ×2 IMPLANT
STENT SYNERGY DES 3.5X12 (Permanent Stent) ×2 IMPLANT
TRANSDUCER W/STOPCOCK (MISCELLANEOUS) ×2 IMPLANT
TUBING CIL FLEX 10 FLL-RA (TUBING) ×2 IMPLANT
VALVE GUARDIAN II ~~LOC~~ HEMO (MISCELLANEOUS) ×2 IMPLANT
WIRE HI TORQ BMW 190CM (WIRE) ×2 IMPLANT

## 2016-11-05 NOTE — Progress Notes (Signed)
CARDIAC REHAB PHASE I   PRE:  Rate/Rhythm: 52 SB  BP:  Sitting: 133/89        SaO2: 100 RA  MODE:  Ambulation: 740 ft   POST:  Rate/Rhythm: 54 SB  BP:  Sitting: 165/94         SaO2: 99 RA  Pt ambulated 740 ft on RA, IV, assist x1, steady gait, tolerated well with no complaints. Pt BP somewhat elevated. Reviewed education, discussed exercise guidelines, ntg use. Pt verbalized understanding. Pt to see case manager regarding brilinta prior to discharge. Pt to edge of bed per pt request after walk, call bell within reach. Will follow.   [id number] Joylene Grapes, RN, BSN 11/05/2016 11:04 AM

## 2016-11-05 NOTE — Interval H&P Note (Signed)
History and Physical Interval Note:  11/05/2016 1:42 PM  Joshua Kirk  has presented today for surgery, with the diagnosis of cad - proximal to mid LAD following lateral MI. The various methods of treatment have been discussed with the patient and family. After consideration of risks, benefits and other options for treatment, the patient has consented to  Procedure(s): CORONARY STENT INTERVENTION (N/A) as a surgical intervention .  The patient's history has been reviewed, patient examined, no change in status, stable for surgery.  I have reviewed the patient's chart and labs.  Questions were answered to the patient's satisfaction.    Cath Lab Visit (complete for each Cath Lab visit)  Clinical Evaluation Leading to the Procedure:   ACS: No.  Non-ACS:    Anginal Classification: No Symptoms - > has yet to ambulate very much in the hallway following his MI. Has severe known disease in LAD with planned staged PCI.  Anti-ischemic medical therapy: Maximal Therapy (2 or more classes of medications)  Non-Invasive Test Results: No non-invasive testing performed  Prior CABG: No previous CABG    Bryan Lemma

## 2016-11-05 NOTE — H&P (View-Only) (Signed)
Progress Note  Patient Name: Joshua Kirk Date of Encounter: 11/05/2016  Primary Cardiologist: new Dr Katrinka Blazing  Subjective   Day 2 s/p LCX MI; no recurrent chest pain  Inpatient Medications    Scheduled Meds: . amLODipine  5 mg Oral Daily  . [START ON 11/06/2016] aspirin EC  81 mg Oral Daily  . atorvastatin  80 mg Oral q1800  . metoprolol tartrate  12.5 mg Oral BID  . sodium chloride flush  3 mL Intravenous Q12H  . sodium chloride flush  3 mL Intravenous Q12H  . ticagrelor  90 mg Oral BID   Continuous Infusions: . sodium chloride    . sodium chloride    . sodium chloride 3 mL/kg/hr (11/05/16 0757)   Followed by  . sodium chloride 1 mL/kg/hr (11/05/16 0758)   PRN Meds: sodium chloride, sodium chloride, acetaminophen, cyclobenzaprine, nitroGLYCERIN, ondansetron (ZOFRAN) IV, oxyCODONE, sodium chloride flush, sodium chloride flush   Vital Signs    Vitals:   11/05/16 0400 11/05/16 0500 11/05/16 0600 11/05/16 0700  BP: 140/71 133/77 122/70 115/68  Pulse: (!) 50     Resp: 14 16 15 13   Temp:      TempSrc:      SpO2: 98%     Weight:      Height:        Intake/Output Summary (Last 24 hours) at 11/05/16 0804 Last data filed at 11/05/16 0600  Gross per 24 hour  Intake              720 ml  Output              500 ml  Net              220 ml    I/O since admission: -2456  Filed Weights   11/03/16 0044 11/04/16 0600  Weight: 185 lb (83.9 kg) 186 lb 4.6 oz (84.5 kg)    Telemetry    Sinus Bradycardia at 58 - 60 - Personally Reviewed  ECG    ECG (independently read by me): SB at 52; 1-2 mm STE V2-3 with peaked T waves; T inversion in 3, aVF  11/04/16 ECG (independently read by me): SB at 54; 1 mm STE V2, 0.5 mm V3; T wave inversion 3,aVF.  Physical Exam   BP 115/68   Pulse (!) 50   Temp 98.2 F (36.8 C) (Oral)   Resp 13   Ht 5\' 8"  (1.727 m)   Wt 186 lb 4.6 oz (84.5 kg)   SpO2 98%   BMI 28.33 kg/m  General: Alert, oriented, no distress.  Skin:  normal turgor, no rashes, warm and dry HEENT: Normocephalic, atraumatic. Pupils equal round and reactive to light; sclera anicteric; extraocular muscles intact;  Nose without nasal septal hypertrophy Mouth/Parynx benign; Mallinpatti scale 3 Neck: No JVD, no carotid bruits; normal carotid upstroke Lungs: clear to ausculatation and percussion; no wheezing or rales Chest wall: without tenderness to palpitation Heart: PMI not displaced, RRR, s1 s2 normal, 1/6 systolic murmur, no diastolic murmur, no rubs, gallops, thrills, or heaves Abdomen: soft, nontender; no hepatosplenomehaly, BS+; abdominal aorta nontender and not dilated by palpation. Back: no CVA tenderness Pulses 2+ Musculoskeletal: full range of motion, normal strength, no joint deformities Extremities: no clubbing cyanosis or edema, Homan's sign negative  Neurologic: grossly nonfocal; Cranial nerves grossly wnl Psychologic: Normal mood and affect   Labs    Chemistry  Recent Labs Lab 11/03/16 0043 11/04/16 0401 11/05/16 0250  NA 136 139  136  K 3.6 3.6 3.6  CL 103 106 103  CO2 27 28 26   GLUCOSE 72 111* 97  BUN 16 11 12   CREATININE 1.24 0.93 0.75  CALCIUM 9.3 8.9 9.2  GFRNONAA 59* >60 >60  GFRAA >60 >60 >60  ANIONGAP 6 5 7      Hematology  Recent Labs Lab 11/03/16 0043 11/03/16 0533 11/04/16 0401  WBC 5.5 5.2 4.6  RBC 4.49 4.25 4.43  HGB 13.2 12.4* 13.1  HCT 38.3* 36.6* 38.5*  MCV 85.3 86.1 86.9  MCH 29.4 29.2 29.6  MCHC 34.5 33.9 34.0  RDW 13.4 13.9 14.2  PLT 150 138* 140*    Cardiac Enzymes  Recent Labs Lab 11/03/16 1803 11/04/16 0401  TROPONINI 55.59* 20.25*     Recent Labs Lab 11/03/16 0057 11/03/16 0516  TROPIPOC 0.05 0.46*     BNPNo results for input(s): BNP, PROBNP in the last 168 hours.   DDimer No results for input(s): DDIMER in the last 168 hours.  Lipid Panel     Component Value Date/Time   CHOL 171 11/05/2016 0250   TRIG 95 11/05/2016 0250   HDL 41 11/05/2016 0250    CHOLHDL 4.2 11/05/2016 0250   VLDL 19 11/05/2016 0250   LDLCALC 111 (H) 11/05/2016 0250     Radiology    No results found.  Cardiac Studies   Conclusion   1. Non-STEMI secondary to total occlusion of the left circumflex, treated with PCI using a 2.25 x 20 mm Synergy DES. PCI procedure is complex related to a fibrocalcific lesion that was difficult to cross with a stent. The procedure required prolonged fluoroscopy, multiple balloon predilatation's, a buddy wire technique, and angioplasty of different subbranches. 2. Residual severe stenosis in the proximal to mid LAD at the level of the first septal perforator 3. Widely patent RCA, subselectively injected 4. Normal LVEDP 5. Left ventriculography is deferred because of high contrast volume  Recommendations:   Dual antiplatelet therapy with aspirin and brilinta 12 months minimum.  Staged PCI of the LAD prior to discharge, possibly tomorrow if patient stable and renal function is within limits  Assessment of LV function with echo  Initiate goal-directed medical therapy with a beta blocker and high intensity statin drug          Post-Intervention Diagram       ------------------------------------------------------------------- 11/04/16 ECHO Study Conclusions  - Left ventricle: The cavity size was normal. There was moderate   concentric hypertrophy. Systolic function was vigorous. The   estimated ejection fraction was in the range of 65% to 70%. Wall   motion was normal; there were no regional wall motion   abnormalities. Doppler parameters are consistent with abnormal   left ventricular relaxation (grade 1 diastolic dysfunction).   There was no evidence of elevated ventricular filling pressure by   Doppler parameters. - Aortic valve: Trileaflet; mildly thickened, mildly calcified   leaflets. There was mild regurgitation. - Mitral valve: Structurally normal valve. There was mild   regurgitation. - Right ventricle:  The cavity size was normal. Wall thickness was   normal. Systolic function was normal. - Right atrium: The atrium was normal in size. - Tricuspid valve: There was trivial regurgitation. - Pulmonic valve: There was no regurgitation. - Pulmonary arteries: Systolic pressure was within the normal   range. - Inferior vena cava: The vessel was normal in size. - Pericardium, extracardiac: There was no pericardial effusion.   Patient Profile     66 y.o. male who developed new  onset chest pain late on 8/25 which persisted throughout the night and underwentt PCI of totally occluded LCX.   Assessment & Plan    1. ACS due to total LCX occlusion ; difficult but successful PCI to LCx on 8/26.  Peak troponin 55.59. No recurrent chest pain. ECHO reviewed; EF nl without WMA.  For staged PCI to LAD today.   Will add ACE-I post MI, but not today with plans for staged PCI to LAD with contrast concerns on renal fxn.    2. Concomitant CAD; plan for staged PCI to LAD.  Creatinine today 0.75 improved with hydration.   3. HLD: LDL 111 atorvastatin 80 mg added 8/27 post ACS; target < 70.  4. HTN:  Amlopidine  5 mg resumed 8/27; titrate depending on BP response  5. Bradycardia: post MI on low dose metoprolol; Asx.   Signed, Lennette Bihari, MD, [facility] 11/05/2016, 8:04 AM

## 2016-11-05 NOTE — Progress Notes (Signed)
Progress Note  Patient Name: Joshua Kirk Date of Encounter: 11/05/2016  Primary Cardiologist: new Dr Katrinka Blazing  Subjective   Day 2 s/p LCX MI; no recurrent chest pain  Inpatient Medications    Scheduled Meds: . amLODipine  5 mg Oral Daily  . [START ON 11/06/2016] aspirin EC  81 mg Oral Daily  . atorvastatin  80 mg Oral q1800  . metoprolol tartrate  12.5 mg Oral BID  . sodium chloride flush  3 mL Intravenous Q12H  . sodium chloride flush  3 mL Intravenous Q12H  . ticagrelor  90 mg Oral BID   Continuous Infusions: . sodium chloride    . sodium chloride    . sodium chloride 3 mL/kg/hr (11/05/16 0757)   Followed by  . sodium chloride 1 mL/kg/hr (11/05/16 0758)   PRN Meds: sodium chloride, sodium chloride, acetaminophen, cyclobenzaprine, nitroGLYCERIN, ondansetron (ZOFRAN) IV, oxyCODONE, sodium chloride flush, sodium chloride flush   Vital Signs    Vitals:   11/05/16 0400 11/05/16 0500 11/05/16 0600 11/05/16 0700  BP: 140/71 133/77 122/70 115/68  Pulse: (!) 50     Resp: 14 16 15 13   Temp:      TempSrc:      SpO2: 98%     Weight:      Height:        Intake/Output Summary (Last 24 hours) at 11/05/16 0804 Last data filed at 11/05/16 0600  Gross per 24 hour  Intake              720 ml  Output              500 ml  Net              220 ml    I/O since admission: -2456  Filed Weights   11/03/16 0044 11/04/16 0600  Weight: 185 lb (83.9 kg) 186 lb 4.6 oz (84.5 kg)    Telemetry    Sinus Bradycardia at 58 - 60 - Personally Reviewed  ECG    ECG (independently read by me): SB at 52; 1-2 mm STE V2-3 with peaked T waves; T inversion in 3, aVF  11/04/16 ECG (independently read by me): SB at 54; 1 mm STE V2, 0.5 mm V3; T wave inversion 3,aVF.  Physical Exam   BP 115/68   Pulse (!) 50   Temp 98.2 F (36.8 C) (Oral)   Resp 13   Ht 5\' 8"  (1.727 m)   Wt 186 lb 4.6 oz (84.5 kg)   SpO2 98%   BMI 28.33 kg/m  General: Alert, oriented, no distress.  Skin:  normal turgor, no rashes, warm and dry HEENT: Normocephalic, atraumatic. Pupils equal round and reactive to light; sclera anicteric; extraocular muscles intact;  Nose without nasal septal hypertrophy Mouth/Parynx benign; Mallinpatti scale 3 Neck: No JVD, no carotid bruits; normal carotid upstroke Lungs: clear to ausculatation and percussion; no wheezing or rales Chest wall: without tenderness to palpitation Heart: PMI not displaced, RRR, s1 s2 normal, 1/6 systolic murmur, no diastolic murmur, no rubs, gallops, thrills, or heaves Abdomen: soft, nontender; no hepatosplenomehaly, BS+; abdominal aorta nontender and not dilated by palpation. Back: no CVA tenderness Pulses 2+ Musculoskeletal: full range of motion, normal strength, no joint deformities Extremities: no clubbing cyanosis or edema, Homan's sign negative  Neurologic: grossly nonfocal; Cranial nerves grossly wnl Psychologic: Normal mood and affect   Labs    Chemistry  Recent Labs Lab 11/03/16 0043 11/04/16 0401 11/05/16 0250  NA 136 139  136  K 3.6 3.6 3.6  CL 103 106 103  CO2 27 28 26   GLUCOSE 72 111* 97  BUN 16 11 12   CREATININE 1.24 0.93 0.75  CALCIUM 9.3 8.9 9.2  GFRNONAA 59* >60 >60  GFRAA >60 >60 >60  ANIONGAP 6 5 7      Hematology  Recent Labs Lab 11/03/16 0043 11/03/16 0533 11/04/16 0401  WBC 5.5 5.2 4.6  RBC 4.49 4.25 4.43  HGB 13.2 12.4* 13.1  HCT 38.3* 36.6* 38.5*  MCV 85.3 86.1 86.9  MCH 29.4 29.2 29.6  MCHC 34.5 33.9 34.0  RDW 13.4 13.9 14.2  PLT 150 138* 140*    Cardiac Enzymes  Recent Labs Lab 11/03/16 1803 11/04/16 0401  TROPONINI 55.59* 20.25*     Recent Labs Lab 11/03/16 0057 11/03/16 0516  TROPIPOC 0.05 0.46*     BNPNo results for input(s): BNP, PROBNP in the last 168 hours.   DDimer No results for input(s): DDIMER in the last 168 hours.  Lipid Panel     Component Value Date/Time   CHOL 171 11/05/2016 0250   TRIG 95 11/05/2016 0250   HDL 41 11/05/2016 0250    CHOLHDL 4.2 11/05/2016 0250   VLDL 19 11/05/2016 0250   LDLCALC 111 (H) 11/05/2016 0250     Radiology    No results found.  Cardiac Studies   Conclusion   1. Non-STEMI secondary to total occlusion of the left circumflex, treated with PCI using a 2.25 x 20 mm Synergy DES. PCI procedure is complex related to a fibrocalcific lesion that was difficult to cross with a stent. The procedure required prolonged fluoroscopy, multiple balloon predilatation's, a buddy wire technique, and angioplasty of different subbranches. 2. Residual severe stenosis in the proximal to mid LAD at the level of the first septal perforator 3. Widely patent RCA, subselectively injected 4. Normal LVEDP 5. Left ventriculography is deferred because of high contrast volume  Recommendations:   Dual antiplatelet therapy with aspirin and brilinta 12 months minimum.  Staged PCI of the LAD prior to discharge, possibly tomorrow if patient stable and renal function is within limits  Assessment of LV function with echo  Initiate goal-directed medical therapy with a beta blocker and high intensity statin drug          Post-Intervention Diagram       ------------------------------------------------------------------- 11/04/16 ECHO Study Conclusions  - Left ventricle: The cavity size was normal. There was moderate   concentric hypertrophy. Systolic function was vigorous. The   estimated ejection fraction was in the range of 65% to 70%. Wall   motion was normal; there were no regional wall motion   abnormalities. Doppler parameters are consistent with abnormal   left ventricular relaxation (grade 1 diastolic dysfunction).   There was no evidence of elevated ventricular filling pressure by   Doppler parameters. - Aortic valve: Trileaflet; mildly thickened, mildly calcified   leaflets. There was mild regurgitation. - Mitral valve: Structurally normal valve. There was mild   regurgitation. - Right ventricle:  The cavity size was normal. Wall thickness was   normal. Systolic function was normal. - Right atrium: The atrium was normal in size. - Tricuspid valve: There was trivial regurgitation. - Pulmonic valve: There was no regurgitation. - Pulmonary arteries: Systolic pressure was within the normal   range. - Inferior vena cava: The vessel was normal in size. - Pericardium, extracardiac: There was no pericardial effusion.   Patient Profile     66 y.o. male who developed new  onset chest pain late on 8/25 which persisted throughout the night and underwentt PCI of totally occluded LCX.   Assessment & Plan    1. ACS due to total LCX occlusion ; difficult but successful PCI to LCx on 8/26.  Peak troponin 55.59. No recurrent chest pain. ECHO reviewed; EF nl without WMA.  For staged PCI to LAD today.   Will add ACE-I post MI, but not today with plans for staged PCI to LAD with contrast concerns on renal fxn.    2. Concomitant CAD; plan for staged PCI to LAD.  Creatinine today 0.75 improved with hydration.   3. HLD: LDL 111 atorvastatin 80 mg added 8/27 post ACS; target < 70.  4. HTN:  Amlopidine  5 mg resumed 8/27; titrate depending on BP response  5. Bradycardia: post MI on low dose metoprolol; Asx.   Signed, Lennette Bihari, MD, [facility] 11/05/2016, 8:04 AM

## 2016-11-06 ENCOUNTER — Encounter (HOSPITAL_COMMUNITY): Payer: Self-pay | Admitting: Cardiology

## 2016-11-06 ENCOUNTER — Telehealth: Payer: Self-pay | Admitting: Cardiovascular Disease

## 2016-11-06 LAB — CBC
HCT: 39.1 % (ref 39.0–52.0)
HEMOGLOBIN: 13 g/dL (ref 13.0–17.0)
MCH: 28.8 pg (ref 26.0–34.0)
MCHC: 33.2 g/dL (ref 30.0–36.0)
MCV: 86.7 fL (ref 78.0–100.0)
PLATELETS: 139 10*3/uL — AB (ref 150–400)
RBC: 4.51 MIL/uL (ref 4.22–5.81)
RDW: 13.7 % (ref 11.5–15.5)
WBC: 4.2 10*3/uL (ref 4.0–10.5)

## 2016-11-06 LAB — BASIC METABOLIC PANEL
ANION GAP: 7 (ref 5–15)
BUN: 11 mg/dL (ref 6–20)
CO2: 26 mmol/L (ref 22–32)
Calcium: 9 mg/dL (ref 8.9–10.3)
Chloride: 106 mmol/L (ref 101–111)
Creatinine, Ser: 0.79 mg/dL (ref 0.61–1.24)
GFR calc Af Amer: >60 mL/min (ref >60)
GLUCOSE: 97 mg/dL (ref 65–99)
Potassium: 3.8 mmol/L (ref 3.5–5.1)
Sodium: 139 mmol/L (ref 135–145)

## 2016-11-06 MED ORDER — NITROGLYCERIN 0.4 MG SL SUBL: 0.4000 mg | SUBLINGUAL_TABLET | SUBLINGUAL | 12 refills | Status: DC | PRN

## 2016-11-06 MED ORDER — ATORVASTATIN CALCIUM 80 MG PO TABS: 80.0000 mg | ORAL_TABLET | Freq: Every day | ORAL | 11 refills | Status: DC

## 2016-11-06 MED ORDER — LISINOPRIL 2.5 MG PO TABS
2.5000 mg | ORAL_TABLET | Freq: Every day | ORAL | Status: DC
  Administered 2016-11-06: 2.5 mg via ORAL
  Filled 2016-11-06: qty 1

## 2016-11-06 MED ORDER — TICAGRELOR 90 MG PO TABS: 90.0000 mg | ORAL_TABLET | Freq: Two times a day (BID) | ORAL | 11 refills | Status: DC

## 2016-11-06 MED ORDER — METOPROLOL TARTRATE 25 MG PO TABS: 12.5000 mg | ORAL_TABLET | Freq: Two times a day (BID) | ORAL | 5 refills | Status: DC

## 2016-11-06 MED ORDER — TICAGRELOR 90 MG PO TABS: 90.0000 mg | ORAL_TABLET | Freq: Two times a day (BID) | ORAL | 0 refills | Status: DC

## 2016-11-06 MED ORDER — AMLODIPINE BESYLATE 5 MG PO TABS: 5.0000 mg | ORAL_TABLET | Freq: Every day | ORAL | 5 refills | Status: DC

## 2016-11-06 MED ORDER — LISINOPRIL 2.5 MG PO TABS: 2.5000 mg | ORAL_TABLET | Freq: Every day | ORAL | 5 refills | Status: DC

## 2016-11-06 NOTE — Discharge Summary (Signed)
Discharge Summary    Patient ID: Joshua Kirk,  MRN: [id number], DOB/AGE: 1950/04/02 66 y.o.  Admit date: 11/03/2016 Discharge date: 11/06/2016  Primary Care Provider: Patient, No Pcp Per Primary Cardiologist: Dr. Excell Seltzer  Discharge Diagnoses    Active Problems:   Non-ST elevation (NSTEMI) myocardial infarction [facility])   NSTEMI (non-ST elevated myocardial infarction) [facility])   Coronary artery disease involving native coronary artery of native heart with angina pectoris (HCC)   Allergies No Known Allergies  Diagnostic Studies/Procedures    LEFT HEART CATH AND CORONARY ANGIOGRAPHY 11/03/16 Conclusion 1. Non-STEMI secondary to total occlusion of the left circumflex, treated with PCI using a 2.25 x 20 mm Synergy DES. PCI procedure is complex related to a fibrocalcific lesion that was difficult to cross with a stent. The procedure required prolonged fluoroscopy, multiple balloon predilatation's, a buddy wire technique, and angioplasty of different subbranches. 2. Residual severe stenosis in the proximal to mid LAD at the level of the first septal perforator 3. Widely patent RCA, subselectively injected 4. Normal LVEDP 5. Left ventriculography is deferred because of high contrast volume  Recommendations:   Dual antiplatelet therapy with aspirin and brilinta 12 months minimum.  Staged PCI of the LAD prior to discharge, possibly tomorrow if patient stable and renal function is within limits  Assessment of LV function with echo  Initiate goal-directed medical therapy with a beta blocker and high intensity statin drug          Post-Intervention Diagram     ------------------------------------------------------------------- 11/04/16 ECHO Study Conclusions  - Left ventricle: The cavity size was normal. There was moderate concentric hypertrophy. Systolic function was vigorous. The estimated ejection fraction was in the range of 65% to 70%. Wall motion was  normal; there were no regional wall motion abnormalities. Doppler parameters are consistent with abnormal left ventricular relaxation (grade 1 diastolic dysfunction). There was no evidence of elevated ventricular filling pressure by Doppler parameters. - Aortic valve: Trileaflet; mildly thickened, mildly calcified leaflets. There was mild regurgitation. - Mitral valve: Structurally normal valve. There was mild regurgitation. - Right ventricle: The cavity size was normal. Wall thickness was normal. Systolic function was normal. - Right atrium: The atrium was normal in size. - Tricuspid valve: There was trivial regurgitation. - Pulmonic valve: There was no regurgitation. - Pulmonary arteries: Systolic pressure was within the normal range. - Inferior vena cava: The vessel was normal in size. - Pericardium, extracardiac: There was no pericardial effusion. -------------------------------------------------------------------------  11/05/16 PCI Conclusion    Mid LAD lesion, 75 %stenosed.  A STENT SYNERGY DES 3.5X12 drug eluting stent was successfully placed. Postdilated tapered fashion from 4.1-3.8 mm.  Post intervention, there is a 0% residual stenosis.  Prox Cx DES Stent (from 8/25), 0 %stenosed.  Dist LAD lesion, 75 %stenosed. Med Rx - medical management  Successful early mid LAD focal PCI with DES stent.  Plan:   Return to nursing unit for ongoing care TR band removal.  Expect that he should be ready for discharge tomorrow.  Continue aggressive risk factor modification and goal-directed care with beta blocker and statin along with dual antiplatelet therapy.      Post-Intervention Diagram     _____________   History of Present Illness     Joshua Kirk is a 66 y.o. male with h/o HTN who presented to the ED with complaints of chest pain on 11/03/16. The discomfort was midsternal, "heaviness" or pressure without radiation. No associated nausea,  diaphoresis, SOB, palpitations or any other  associated sx. Onset was while he was at a party dancing and celebrating a relative's graduation. The pain increased and did not abate when he sat down to rest. Due to persistence of pain, pt came in for evaluation.  Hospital Course     In the ED EKG showed isolated ST elevation in V2, with maybe 0.54mm ST depression and TWI in the inferior leads (which had deepened since arrival tracing on repeat). The patient was treated for ACS due to total LCX occlusion ; difficult but successful PCI to LCx on 8/26.  Peak troponin 55.59. No recurrent chest pain. ECHO reviewed; EF nl without WMA.  Underwent staged PCI to LAD yesterday, 11/05/16. Cr 0.79 today;  add low dose ACE-I post MI with outpatient titration.   Concomitant CAD; s/p PCI to LAD with excellent result. Medical Rx for concomitant CAD    HLD: LDL 111, atorvastatin 80 mg added 8/27 post ACS; target < 70.  HTN:  Amlopidine  5 mg resumed 8/27; BP controlled at 124/79 presently.  Bradycardia: post MI on low dose metoprolol; Asx.    Patient has been seen by Dr. Tresa Endo today and deemed ready for discharge home. All follow up appointments have been scheduled. Discharge medications are listed below. _____________  Discharge Vitals Blood pressure 124/79, pulse (!) 52, temperature 99 F (37.2 C), temperature source Oral, resp. rate 16, height 5\' 8"  (1.727 m), weight 184 lb 12.8 oz (83.8 kg), SpO2 99 %.  Filed Weights   11/03/16 0044 11/04/16 0600 11/05/16 0800  Weight: 185 lb (83.9 kg) 186 lb 4.6 oz (84.5 kg) 184 lb 12.8 oz (83.8 kg)    Labs & Radiologic Studies    CBC  Recent Labs  11/04/16 0401 11/06/16 0729  WBC 4.6 4.2  HGB 13.1 13.0  HCT 38.5* 39.1  MCV 86.9 86.7  PLT 140* 139*   Basic Metabolic Panel  Recent Labs  11/05/16 0250 11/06/16 0729  NA 136 139  K 3.6 3.8  CL 103 106  CO2 26 26  GLUCOSE 97 97  BUN 12 11  CREATININE 0.75 0.79  CALCIUM 9.2 9.0   Liver Function  Tests No results for input(s): AST, ALT, ALKPHOS, BILITOT, PROT, ALBUMIN in the last 72 hours. No results for input(s): LIPASE, AMYLASE in the last 72 hours. Cardiac Enzymes  Recent Labs  11/03/16 1803 11/04/16 0401  TROPONINI 55.59* 20.25*   BNP Invalid input(s): POCBNP D-Dimer No results for input(s): DDIMER in the last 72 hours. Hemoglobin A1C No results for input(s): HGBA1C in the last 72 hours. Fasting Lipid Panel  Recent Labs  11/05/16 0250  CHOL 171  HDL 41  LDLCALC 111*  TRIG 95  CHOLHDL 4.2   Thyroid Function Tests No results for input(s): TSH, T4TOTAL, T3FREE, THYROIDAB in the last 72 hours.  Invalid input(s): FREET3 _____________  Dg Chest 2 View  Result Date: 11/03/2016 CLINICAL DATA:  Central chest pain. EXAM: CHEST  2 VIEW COMPARISON:  None. FINDINGS: Mild-to-moderate cardiomegaly. Aortic tortuosity. No pulmonary edema. No confluent airspace disease, pleural fluid or pneumothorax. No acute osseous abnormality. IMPRESSION: Cardiomegaly with tortuous thoracic aorta. No acute pulmonary process. Electronically Signed   By: Rubye Oaks M.D.   On: 11/03/2016 01:36   Disposition   Pt is being discharged home today in good condition.  Follow-up Plans & Appointments   Follow-up Information    Beatrice Lecher, PA-C Follow up.   Specialties:  Cardiology, Physician Assistant Why:  On September 7th at 8:45 for hospital  follow up with Dr. Earmon Phoenix PA. Please bring all medications with you to the appointment.  Contact information: [address] [place] [address] [phone]          Discharge Instructions    AMB Referral to Cardiac Rehabilitation - Phase II    Complete by:  As directed    Diagnosis:   STEMI Coronary Stents     Amb Referral to Cardiac Rehabilitation    Complete by:  As directed    Diagnosis:   Coronary Stents PTCA NSTEMI     Diet - low sodium heart healthy    Complete by:  As directed    Discharge  instructions    Complete by:  As directed    PLEASE REMEMBER TO BRING ALL OF YOUR MEDICATIONS TO EACH OF YOUR FOLLOW-UP OFFICE VISITS.  PLEASE ATTEND ALL SCHEDULED FOLLOW-UP APPOINTMENTS.   Activity: Increase activity slowly as tolerated. You may shower, but no soaking baths (or swimming) for 1 week. You may not return to work until cleared by your cardiologist. No lifting over 10 lbs for 4 weeks. No sexual activity for 4 weeks.   Wound Care: You may wash cath site gently with soap and water. Keep cath site clean and dry. If you notice pain, swelling, bleeding or pus at your cath site, please call [phone].   Increase activity slowly    Complete by:  As directed       Discharge Medications   Current Discharge Medication List    START taking these medications   Details  atorvastatin (LIPITOR) 80 MG tablet Take 1 tablet (80 mg total) by mouth daily at 6 PM. Qty: 80 tablet, Refills: 11    lisinopril (PRINIVIL,ZESTRIL) 2.5 MG tablet Take 1 tablet (2.5 mg total) by mouth daily. Qty: 30 tablet, Refills: 5    metoprolol tartrate (LOPRESSOR) 25 MG tablet Take 0.5 tablets (12.5 mg total) by mouth 2 (two) times daily. Qty: 30 tablet, Refills: 5    nitroGLYCERIN (NITROSTAT) 0.4 MG SL tablet Place 1 tablet (0.4 mg total) under the tongue every 5 (five) minutes as needed for chest pain. Qty: 25 tablet, Refills: 12    ticagrelor (BRILINTA) 90 MG TABS tablet Take 1 tablet (90 mg total) by mouth 2 (two) times daily. Qty: 60 tablet, Refills: 11      CONTINUE these medications which have CHANGED   Details  amLODipine (NORVASC) 5 MG tablet Take 1 tablet (5 mg total) by mouth daily. Qty: 30 tablet, Refills: 5      CONTINUE these medications which have NOT CHANGED   Details  aspirin EC 81 MG tablet Take 81 mg by mouth daily.    cyclobenzaprine (FLEXERIL) 10 MG tablet Take 1 tablet (10 mg total) by mouth 3 (three) times daily as needed for muscle spasms. Qty: 12 tablet, Refills: 0        STOP taking these medications     ibuprofen (ADVIL,MOTRIN) 600 MG tablet         Aspirin prescribed at discharge?  Yes High Intensity Statin Prescribed? (Lipitor 40-80mg  or Crestor 20-40mg ): Yes Beta Blocker Prescribed? Yes For EF <40%, was ACEI/ARB Prescribed? yes ADP Receptor Inhibitor Prescribed? (i.e. Plavix etc.-Includes Medically Managed Patients): Yes For EF <40%, Aldosterone Inhibitor Prescribed? No: EF 65-70% Was EF assessed during THIS hospitalization? Yes Was Cardiac Rehab II ordered? (Included Medically managed Patients): Yes   Outstanding Labs/Studies     Duration of Discharge Encounter   Greater than 30 minutes including physician  time.  Signed, Berton Bon NP 11/06/2016, 9:45 AM

## 2016-11-06 NOTE — Discharge Instructions (Signed)
Acute Coronary Syndrome °Acute coronary syndrome (ACS) is a serious problem in which there is suddenly not enough blood and oxygen supplied to the heart. ACS may mean that one or more of the blood vessels in your heart (coronary arteries) may be blocked. ACS can result in chest pain or a heart attack (myocardial infarction or MI). °What are the causes? °This condition is caused by atherosclerosis, which is the buildup of fat and cholesterol (plaque) on the inside of the arteries. Over time, the plaque may narrow or block the artery, and this will lessen blood flow to the heart. Plaque can also become weak and break off within a coronary artery to form a clot and cause a sudden blockage. °What increases the risk? °The risk factors of this condition include: °· High cholesterol levels. °· High blood pressure (hypertension). °· Smoking. °· Diabetes. °· Age. °· Family history of chest pain, heart disease, or stroke. °· Lack of exercise. °What are the signs or symptoms? °The most common signs of this condition include: °· Chest pain, which can be: °¨ A crushing or squeezing in the chest. °¨ A tightness, pressure, fullness, or heaviness in the chest. °¨ Present for more than a few minutes, or it can stop and recur. °· Pain in the arms, neck, jaw, or back. °· Unexplained heartburn or indigestion. °· Shortness of breath. °· Nausea. °· Sudden cold sweats. °· Feeling light-headed or dizzy. °Sometimes, this condition has no symptoms. °How is this diagnosed? °ACS may be diagnosed through the following tests: °· Electrocardiogram (ECG). °· Blood tests. °· Coronary angiogram. This is a procedure to look at the coronary arteries to see if there is any blockage. °How is this treated? °Treatment for ACS may include: °· Healthy behavioral changes to reduce or control risk factors. °· Medicine. °· Coronary stenting. A stent helps to keep an artery open. °· Coronary angioplasty. This procedure widens a narrowed or blocked  artery. °· Coronary artery bypass surgery. This will allow your blood to pass the blockage (bypass) to reach your heart. °Follow these instructions at home: °Eating and drinking °· Follow a heart-healthy diet. A dietitian can you help to educate you about healthy food options and changes. °· Use healthy cooking methods such as roasting, grilling, broiling, baking, poaching, steaming, or stir-frying. Talk to a dietitian to learn more about healthy cooking methods. °Medicines °· Take medicines only as directed by your health care provider. °· Do not take the following medicines unless your health care provider approves: °¨ Nonsteroidal anti-inflammatory drugs (NSAIDs), such as ibuprofen, naproxen, or celecoxib. °¨ Vitamin supplements that contain vitamin A, vitamin E, or both. °¨ Hormone replacement therapy that contains estrogen with or without progestin. °· Stop illegal drug use. °Activity °· Follow an exercise program that is approved by your health care provider. °· Plan rest periods when you are fatigued. °Lifestyle °· Do not use any tobacco products, including cigarettes, chewing tobacco, or electronic cigarettes. If you need help quitting, ask your health care provider. °· If you drink alcohol, and your health care provider approves, limit your alcohol intake to no more than 1 drink per day. One drink equals 12 ounces of beer, 5 ounces of wine, or 1½ ounces of hard liquor. °· Learn to manage stress. °· Maintain a healthy weight. Lose weight as approved by your health care provider. °General instructions °· Manage other health conditions, such as hypertension and diabetes, as directed by your health care provider. °· Keep all follow-up visits as directed by your   health care provider. This is important. °· Your health care provider may ask you to monitor your blood pressure. A blood pressure reading consists of a higher number over a lower number, such as 110 over 72, written as 110/72. Ideally, your blood  pressure should be: °¨ Below 140/90 if you have no other medical conditions. °¨ Below 130/80 if you have diabetes or kidney disease. °Get help right away if: °· You have pain in your chest, neck, arm, jaw, stomach, or back that lasts more than a few minutes, is recurring, or is not relieved by taking medicine under your tongue (sublingual nitroglycerin). °· You have profuse sweating without cause. °· You have unexplained: °¨ Heartburn or indigestion. °¨ Shortness of breath or difficulty breathing. °¨ Nausea or vomiting. °¨ Fatigue. °¨ Feelings of nervousness or anxiety. °¨ Weakness. °¨ Diarrhea. °· You have sudden light-headedness or dizziness. °· You faint. °These symptoms may represent a serious problem that is an emergency. Do not wait to see if the symptoms will go away. Get medical help right away. Call your local emergency services (911 in the [place] Do not drive yourself to the clinic or hospital.  °This information is not intended to replace advice given to you by your health care provider. Make sure you discuss any questions you have with your health care provider. °Document Released: 02/25/2005 Document Revised: 08/09/2015 Document Reviewed: 06/29/2013 °Elsevier Interactive Patient Education © 2017 [employer] ° °

## 2016-11-06 NOTE — Progress Notes (Signed)
Pt just returned from walking 740 ft with his wife. Feeling well. Gave pt his stent card, got new insurance card from him for CRPII ([place]), and discussed walking guidelines again with wife present. He is requesting a work note. Notified RN. CM discussing Brilinta.  [phone] Ethelda ChickKristan Lasandra Batley CES, ACSM 10:15 AM 11/06/2016

## 2016-11-06 NOTE — Care Management Note (Signed)
Case Management Note  Patient Details  Name: Sherian MaroonClement E Paddock MRN: [id number] Date of Birth: 06/21/1950  Subjective/Objective:      Pt admitted with NSTEMI              Action/Plan:   PTA independent from home with wife.  CM submitted benefit check for Brilinta - copay of $55 communicated to pt.  CM provided free 30 day supply card to pt.  CM contacted [facility] choice [facility] and confirmed that they can fill prescription today and that they accept the free 30 day card.  Pt states he active relationship with PCP.   Expected Discharge Date:  11/06/16               Expected Discharge Plan:  Home/Self Care  In-House Referral:     Discharge planning Services  CM Consult, Medication Assistance  Post Acute Care Choice:    Choice offered to:     DME Arranged:    DME Agency:     HH Arranged:    HH Agency:     Status of Service:  Completed, signed off  If discussed at [employer] Length [employer] Stay Meetings, dates discussed:    Additional Comments:  Cherylann ParrClaxton, Jayonna Meyering S, RN 11/06/2016, 10:14 AM

## 2016-11-06 NOTE — Progress Notes (Signed)
[facility]. Joshua Kirk [facility] from 11/[facility]/16 until 11/06/16.  Mr. Joshua Kirk is not able to drive or return to work until he goes to his follow up appointment on 11/15/16 and receives further instructions.    Nelly LaurenceNatalie Corrisa Gibby, RN, BSN, CCRN [facility] [phone]

## 2016-11-06 NOTE — Progress Notes (Signed)
Progress Note  Patient Name: Joshua Kirk Date of Encounter: 11/06/2016  Primary Cardiologist: new Dr Katrinka Blazing or Excell Seltzer  Subjective   Day 3 s/p LCX MI; Day 1 s/p LAD PCI; no recurrent chest pain  Inpatient Medications    Scheduled Meds: . amLODipine  5 mg Oral Daily  . aspirin EC  81 mg Oral Daily  . atorvastatin  80 mg Oral q1800  . metoprolol tartrate  12.5 mg Oral BID  . sodium chloride flush  3 mL Intravenous Q12H  . sodium chloride flush  3 mL Intravenous Q12H  . ticagrelor  90 mg Oral BID   Continuous Infusions: . sodium chloride     PRN Meds: sodium chloride, acetaminophen, cyclobenzaprine, nitroGLYCERIN, ondansetron (ZOFRAN) IV, oxyCODONE, sodium chloride flush, sodium chloride flush   Vital Signs    Vitals:   11/06/16 0400 11/06/16 0500 11/06/16 0600 11/06/16 0700  BP: 132/90 (!) 143/85 (!) 141/91 138/81  Pulse: (!) 52 (!) 54 (!) 52 (!) 52  Resp: 14 16 13 17   Temp: 98 F (36.7 C)     TempSrc: Oral     SpO2: 98% 98% 99% 96%  Weight:      Height:        Intake/Output Summary (Last 24 hours) at 11/06/16 0800 Last data filed at 11/05/16 1900  Gross per 24 hour  Intake          1135.83 ml  Output              300 ml  Net           835.83 ml    I/O since admission: -2456  Filed Weights   11/03/16 0044 11/04/16 0600 11/05/16 0800  Weight: 185 lb (83.9 kg) 186 lb 4.6 oz (84.5 kg) 184 lb 12.8 oz (83.8 kg)    Telemetry    Sinus Bradycardia at 55 - Personally Reviewed  ECG    ECG (independently read by me): SB at 52; 1-2 mm STE V2-3 with peaked T waves; T inversion in 3, aVF  11/04/16 ECG (independently read by me): SB at 54; 1 mm STE V2, 0.5 mm V3; T wave inversion 3,aVF.  Physical Exam   BP 138/81   Pulse (!) 52   Temp 98 F (36.7 C) (Oral)   Resp 17   Ht 5\' 8"  (1.727 m)   Wt 184 lb 12.8 oz (83.8 kg)   SpO2 96%   BMI 28.10 kg/m  General: Alert, oriented, no distress.  Skin: normal turgor, no rashes, warm and dry HEENT:  Normocephalic, atraumatic. Pupils equal round and reactive to light; sclera anicteric; extraocular muscles intact; Nose without nasal septal hypertrophy Mouth/Parynx benign; Mallinpatti scale 3 Neck: No JVD, no carotid bruits; normal carotid upstroke Lungs: clear to ausculatation and percussion; no wheezing or rales Chest wall: without tenderness to palpitation Heart: PMI not displaced, RRR, s1 s2 normal, 1/6 systolic murmur, no diastolic murmur, no rubs, gallops, thrills, or heaves Abdomen: soft, nontender; no hepatosplenomehaly, BS+; abdominal aorta nontender and not dilated by palpation. Back: no CVA tenderness Pulses 2+ Musculoskeletal: full range of motion, normal strength, no joint deformities Extremities: no clubbing cyanosis or edema, Homan's sign negative  Neurologic: grossly nonfocal; Cranial nerves grossly wnl Psychologic: Normal mood and affect   Labs    Chemistry  Recent Labs Lab 11/03/16 0043 11/04/16 0401 11/05/16 0250  NA 136 139 136  K 3.6 3.6 3.6  CL 103 106 103  CO2 27 28 26   GLUCOSE  72 111* 97  BUN 16 11 12   CREATININE 1.24 0.93 0.75  CALCIUM 9.3 8.9 9.2  GFRNONAA 59* >60 >60  GFRAA >60 >60 >60  ANIONGAP 6 5 7      Hematology  Recent Labs Lab 11/03/16 0043 11/03/16 0533 11/04/16 0401  WBC 5.5 5.2 4.6  RBC 4.49 4.25 4.43  HGB 13.2 12.4* 13.1  HCT 38.3* 36.6* 38.5*  MCV 85.3 86.1 86.9  MCH 29.4 29.2 29.6  MCHC 34.5 33.9 34.0  RDW 13.4 13.9 14.2  PLT 150 138* 140*    Cardiac Enzymes  Recent Labs Lab 11/03/16 1803 11/04/16 0401  TROPONINI 55.59* 20.25*     Recent Labs Lab 11/03/16 0057 11/03/16 0516  TROPIPOC 0.05 0.46*     BNPNo results for input(s): BNP, PROBNP in the last 168 hours.   DDimer No results for input(s): DDIMER in the last 168 hours.  Lipid Panel     Component Value Date/Time   CHOL 171 11/05/2016 0250   TRIG 95 11/05/2016 0250   HDL 41 11/05/2016 0250   CHOLHDL 4.2 11/05/2016 0250   VLDL 19 11/05/2016  0250   LDLCALC 111 (H) 11/05/2016 0250     Radiology    No results found.  Cardiac Studies   Conclusion   1. Non-STEMI secondary to total occlusion of the left circumflex, treated with PCI using a 2.25 x 20 mm Synergy DES. PCI procedure is complex related to a fibrocalcific lesion that was difficult to cross with a stent. The procedure required prolonged fluoroscopy, multiple balloon predilatation's, a buddy wire technique, and angioplasty of different subbranches. 2. Residual severe stenosis in the proximal to mid LAD at the level of the first septal perforator 3. Widely patent RCA, subselectively injected 4. Normal LVEDP 5. Left ventriculography is deferred because of high contrast volume  Recommendations:   Dual antiplatelet therapy with aspirin and brilinta 12 months minimum.  Staged PCI of the LAD prior to discharge, possibly tomorrow if patient stable and renal function is within limits  Assessment of LV function with echo  Initiate goal-directed medical therapy with a beta blocker and high intensity statin drug          Post-Intervention Diagram       ------------------------------------------------------------------- 11/04/16 ECHO Study Conclusions  - Left ventricle: The cavity size was normal. There was moderate   concentric hypertrophy. Systolic function was vigorous. The   estimated ejection fraction was in the range of 65% to 70%. Wall   motion was normal; there were no regional wall motion   abnormalities. Doppler parameters are consistent with abnormal   left ventricular relaxation (grade 1 diastolic dysfunction).   There was no evidence of elevated ventricular filling pressure by   Doppler parameters. - Aortic valve: Trileaflet; mildly thickened, mildly calcified   leaflets. There was mild regurgitation. - Mitral valve: Structurally normal valve. There was mild   regurgitation. - Right ventricle: The cavity size was normal. Wall thickness was    normal. Systolic function was normal. - Right atrium: The atrium was normal in size. - Tricuspid valve: There was trivial regurgitation. - Pulmonic valve: There was no regurgitation. - Pulmonary arteries: Systolic pressure was within the normal   range. - Inferior vena cava: The vessel was normal in size. - Pericardium, extracardiac: There was no pericardial effusion. -------------------------------------------------------------------------  11/05/16 PCI Conclusion     Mid LAD lesion, 75 %stenosed.  A STENT SYNERGY DES 3.5X12 drug eluting stent was successfully placed. Postdilated tapered fashion from 4.1-3.8 mm.  Post intervention, there is a 0% residual stenosis.  Prox Cx DES Stent (from 8/25), 0 %stenosed.  Dist LAD lesion, 75 %stenosed. Med Rx - medical management   Successful early mid LAD focal PCI with DES stent.  Plan:   Return to nursing unit for ongoing care TR band removal.  Expect that he should be ready for discharge tomorrow.  Continue aggressive risk factor modification and goal-directed care with beta blocker and statin along with dual antiplatelet therapy.       Post-Intervention Diagram    Patient Profile     66 y.o. male who developed new onset chest pain late on 8/25 which persisted throughout the night and underwentt PCI of totally occluded LCX.   Assessment & Plan    1. ACS due to total LCX occlusion ; difficult but successful PCI to LCx on 8/26.  Peak troponin 55.59. No recurrent chest pain. ECHO reviewed; EF nl without WMA.  Underwent staged PCI to LAD yesterday. Cr 0.75 yesterday; pending so far this am,  add low dose ACE-I post MI with outpatient titration.   2. Concomitant CAD; s/p PCI to LAD with excellent result. Medical Rx for concomitant CAD    3. HLD: LDL 111 atorvastatin 80 mg added 8/27 post ACS; target < 70.  4. HTN:  Amlopidine  5 mg resumed 8/27; BP controlled at 124/79 presently.  5. Bradycardia: post MI on low dose  metoprolol; Asx.   Ambulate this am and plan for dc later today; cardiac rehab.   Signed, Lennette Bihari, MD, [facility] 11/06/2016, 8:00 AM

## 2016-11-06 NOTE — Telephone Encounter (Signed)
New message    1-2 week TOC appt made per Lizabeth LeydenNina Hammond on 11/15/16 with Tereso NewcomerScott Weaver.

## 2016-11-12 NOTE — Telephone Encounter (Signed)
1st TCM attempt: Someone answered the phone but hung up without speaking.  I called back and left a message asking the pt to call me back.

## 2016-11-13 NOTE — Telephone Encounter (Signed)
2nd TCM call attempt: LMTCB. 

## 2016-11-14 DIAGNOSIS — I10 Essential (primary) hypertension: Secondary | ICD-10-CM | POA: Insufficient documentation

## 2016-11-14 DIAGNOSIS — E785 Hyperlipidemia, unspecified: Secondary | ICD-10-CM

## 2016-11-14 HISTORY — DX: Hyperlipidemia, unspecified: E78.5

## 2016-11-14 HISTORY — DX: Essential (primary) hypertension: I10

## 2016-11-14 NOTE — Progress Notes (Signed)
Cardiology Office Note:    Date:  11/15/2016   ID:  Joshua Kirk, DOB 05-28-1950, MRN [id number]  PCP:  Patient, No Pcp Per  Cardiologist:  Dr. Sherren Mocha    Referring MD: No ref. provider found   Chief Complaint  Patient presents with  . Hospitalization Follow-up    Status post MI >> PCI    History of Present Illness:    RUSHAWN CAPSHAW is a 66 y.o. male with a hx of HTN.  He was admitted 8/26-8/29 with a NSTEMI.  Cardiac Catheterization demonstrated an occluded LCx that was tx with a DES and severe proximal LAD stenosis.  The procedure required prolonged fluoroscopy time, multiple balloon pre-dilations, buddy wire technique and angioplasty of different sub-branches.  Staged PCI was arranged on the mid LAD.  This was successfully treated with a DES.  There was residual distal LAD disease that is to be tx medically.  Echo demonstrated preserved LVF.    Mr. Levingston returns for post hospital follow up.  He is dong well.  He denies chest pain, shortness of breath, syncope, orthopnea, PND or significant pedal edema.   Prior CV studies:   The following studies were reviewed today:  Cardiac Catheterization/PCI 11/05/16 PCI: 3.5x12 Synergy DES to mid LAD  Cardiac Catheterization 11/03/16 LAD mid 75, dist 75 RI mild disease LCx prox 100 RCA irregs PCI: 2.25 x 20 mm Synergy DES to prox LCx  Echocardiogram 11/04/16 Mod conc LVH, vigorous LVF, EF 65-70, no RWMA, Gr 1 DD, mild AI, mild MR   Past Medical History:  Diagnosis Date  . CAD (coronary artery disease)    NSTEMI 8/18: mLAD 75, dLAD 75, pLCx 100 >> PCI: DES to pLCx // staged PCI 8/18: DES to mLAD // Echo 8/18: Mod conc LVH, vigorous LVF, EF 65-70, no RWMA, Gr 1 DD, mild AI, mild MR  . Essential hypertension 11/14/2016  . Hyperlipidemia 11/14/2016  . Hypertension   . NSTEMI (non-ST elevated myocardial infarction) ([place]) 11/03/2016    Past Surgical History:  Procedure Laterality Date  . CORONARY STENT INTERVENTION  N/A 11/03/2016   Procedure: CORONARY STENT INTERVENTION;  Surgeon: Sherren Mocha, MD;  Location: [place] [facility];  Service: Cardiovascular;  Laterality: N/A;  . CORONARY STENT INTERVENTION N/A 11/05/2016   Procedure: CORONARY STENT INTERVENTION;  Surgeon: Leonie Man, MD;  Location: [place] [facility];  Service: Cardiovascular;  Laterality: N/A;  . LEFT HEART CATH AND CORONARY ANGIOGRAPHY N/A 11/03/2016   Procedure: LEFT HEART CATH AND CORONARY ANGIOGRAPHY;  Surgeon: Sherren Mocha, MD;  Location: [place] [facility];  Service: Cardiovascular;  Laterality: N/A;    Current Medications: Current Meds  Medication Sig  . amLODipine (NORVASC) 5 MG tablet Take 1 tablet (5 mg total) by mouth daily.  Marland Kitchen aspirin EC 81 MG tablet Take 81 mg by mouth daily.  Marland Kitchen atorvastatin (LIPITOR) 80 MG tablet Take 1 tablet (80 mg total) by mouth daily at 6 PM.  . nitroGLYCERIN (NITROSTAT) 0.4 MG SL tablet Place 1 tablet (0.4 mg total) under the tongue every 5 (five) minutes as needed for chest pain.  . ticagrelor (BRILINTA) 90 MG TABS tablet Take 1 tablet (90 mg total) by mouth 2 (two) times daily.  . [DISCONTINUED] lisinopril (PRINIVIL,ZESTRIL) 2.5 MG tablet Take 1 tablet (2.5 mg total) by mouth daily.  . [DISCONTINUED] metoprolol tartrate (LOPRESSOR) 25 MG tablet Take 0.5 tablets (12.5 mg total) by mouth 2 (two) times daily.     Allergies:  Patient has no known allergies.   Social History   Social History  . Marital status: Married    Spouse name: N/A  . Number of children: N/A  . Years of education: N/A   Occupational History  . Professor [employer]   Social History Main Topics  . Smoking status: Never Smoker  . Smokeless tobacco: Never Used  . Alcohol use Yes     Comment: Occasionally  . Drug use: Unknown  . Sexual activity: Not Asked   Other Topics Concern  . None   Social History Narrative   Originally from [place]   Professor at [employer]   Wife is Family Physician with [employer]      Family  Hx: The patient's Family history is unknown by patient.  ROS:   Please see the history of present illness.    ROS All other systems reviewed and are negative.   EKGs/Labs/Other Test Reviewed:    EKG:  EKG is   ordered today.  The ekg ordered today demonstrates Sinus bradycardia, HR 48, left axis deviation, J-point elevation in V2, QTc 414 ms  Recent Labs: 11/06/2016: BUN 11; Creatinine, Ser 0.79; Hemoglobin 13.0; Platelets 139; Potassium 3.8; Sodium 139   Recent Lipid Panel Lab Results  Component Value Date/Time   CHOL 171 11/05/2016 02:50 AM   TRIG 95 11/05/2016 02:50 AM   HDL 41 11/05/2016 02:50 AM   CHOLHDL 4.2 11/05/2016 02:50 AM   LDLCALC 111 (H) 11/05/2016 02:50 AM    Physical Exam:    VS:  BP 130/70   Pulse (!) 48   Ht 5' 8" (1.727 m)   Wt 188 lb 12.8 oz (85.6 kg)   BMI 28.71 kg/m     Wt Readings from Last 3 Encounters:  11/15/16 188 lb 12.8 oz (85.6 kg)  11/05/16 184 lb 12.8 oz (83.8 kg)     Physical Exam  Constitutional: He is oriented to person, place, and time. He appears well-developed and well-nourished. No distress.  HENT:  Head: Normocephalic and atraumatic.  Eyes: No scleral icterus.  Neck: No JVD present. No thyromegaly present.  Musculoskeletal: He exhibits no edema. Deformity: R wrist without hematoma.  Neurological: He is alert and oriented to person, place, and time.  Skin: Skin is warm and dry. No rash (anterior chest and back demonstrate no rash) noted.  Psychiatric: He has a normal mood and affect.    ASSESSMENT:    1. Non-ST elevation (NSTEMI) myocardial infarction ([place])   2. Coronary artery disease involving native coronary artery of native heart with angina pectoris ([place])   3. Hyperlipidemia, unspecified hyperlipidemia type   4. Essential hypertension    PLAN:    In order of problems listed above:  1. Non-ST elevation (NSTEMI) myocardial infarction [facility]) Status post non-STEMI treated with DES to an occluded LCx and staged PCI  with DES to the mid LAD. He has residual 75% distal LAD stenosis which is treated medically. We discussed his cardiac catheterization results. EF is preserved. We discussed the importance of dual anti-platelet therapy. He is interested in cardiac rehabilitation and will notify us if he is not contacted by them.  -  Continue aspirin, Ticagrelor, beta blocker, ACE inhibitor, statin  -  Change beta blocker to the Toprol succinate 12.5 mg daily secondary to bradycardia  2. Coronary artery disease involving native coronary artery of native heart with angina pectoris [facility]) He denies recurrent angina. Continue aspirin, Ticagrelor, beta blocker, ACE inhibitor, statin.  3. Hyperlipidemia, unspecified hyperlipidemia  type Continue high-dose statin. Arrange follow-up CMET, lipids in 4 weeks.  4. Essential hypertension Borderline control. Obtain BMET today. Increase lisinopril to 5 mg daily. Adjust beta blocker for bradycardia. Obtain follow-up CMET in 4 weeks.  Dispo:  Return in about 3 months (around 02/14/2017) for Routine Follow Up, w/ Dr. Burt Knack.   Medication Adjustments/Labs and Tests Ordered: Current medicines are reviewed at length with the patient today.  Concerns regarding medicines are outlined above.  Tests Ordered: Orders Placed This Encounter  Procedures  . Basic Metabolic Panel (BMET)  . Lipid Profile  . Comp Met (CMET)  . EKG 12-Lead   Medication Changes: Meds ordered this encounter  Medications  . metoprolol succinate (TOPROL XL) 25 MG 24 hr tablet    Sig: Take 0.5 tablets (12.5 mg total) by mouth daily.    Dispense:  90 tablet    Refill:  3    CHANGE IN THERAPY ; D/C TARTRATE  . lisinopril (PRINIVIL,ZESTRIL) 5 MG tablet    Sig: Take 1 tablet (5 mg total) by mouth daily.    Dispense:  90 tablet    Refill:  3    DOSE INCREASE    Signed, Richardson Dopp, PA-C  11/15/2016 9:55 AM    [facility] [address], [place], [place]  [address] Phone: [phone]; Fax: [phone]

## 2016-11-15 ENCOUNTER — Ambulatory Visit (INDEPENDENT_AMBULATORY_CARE_PROVIDER_SITE_OTHER): Payer: PRIVATE HEALTH INSURANCE | Admitting: Physician Assistant

## 2016-11-15 ENCOUNTER — Encounter: Payer: Self-pay | Admitting: Physician Assistant

## 2016-11-15 ENCOUNTER — Telehealth: Payer: Self-pay | Admitting: *Deleted

## 2016-11-15 VITALS — BP 130/70 | HR 48 | Ht 68.0 in | Wt 188.8 lb

## 2016-11-15 DIAGNOSIS — I214 Non-ST elevation (NSTEMI) myocardial infarction: Secondary | ICD-10-CM

## 2016-11-15 DIAGNOSIS — I10 Essential (primary) hypertension: Secondary | ICD-10-CM | POA: Diagnosis not present

## 2016-11-15 DIAGNOSIS — E785 Hyperlipidemia, unspecified: Secondary | ICD-10-CM | POA: Diagnosis not present

## 2016-11-15 DIAGNOSIS — I25119 Atherosclerotic heart disease of native coronary artery with unspecified angina pectoris: Secondary | ICD-10-CM

## 2016-11-15 LAB — BASIC METABOLIC PANEL
BUN/Creatinine Ratio: 15 (ref 10–24)
BUN: 16 mg/dL (ref 8–27)
CALCIUM: 9.3 mg/dL (ref 8.6–10.2)
CHLORIDE: 102 mmol/L (ref 96–106)
CO2: 24 mmol/L (ref 20–29)
Creatinine, Ser: 1.07 mg/dL (ref 0.76–1.27)
GFR calc Af Amer: 83 mL/min/{1.73_m2} (ref >59)
GFR, EST NON AFRICAN AMERICAN: 72 mL/min/{1.73_m2} (ref >59)
Glucose: 91 mg/dL (ref 65–99)
POTASSIUM: 4.2 mmol/L (ref 3.5–5.2)
Sodium: 139 mmol/L (ref 134–144)

## 2016-11-15 MED ORDER — LISINOPRIL 5 MG PO TABS: 5.0000 mg | ORAL_TABLET | Freq: Every day | ORAL | 3 refills | Status: DC

## 2016-11-15 MED ORDER — METOPROLOL SUCCINATE ER 25 MG PO TB24: 12.5000 mg | ORAL_TABLET | Freq: Every day | ORAL | 3 refills | Status: DC

## 2016-11-15 NOTE — Telephone Encounter (Signed)
-----   Message from Beatrice LecherScott T Weaver, [place] sent at 11/15/2016  5:00 PM EDT ----- Please call patient: The kidney function (BUN, Creatinine) and potassium are normal. All other parameters are within acceptable limits and no further intervention or testing required. Continue with current treatment plan. Tereso NewcomerScott Weaver, PA-C    11/15/2016 5:00 PM

## 2016-11-15 NOTE — Telephone Encounter (Signed)
Lmtcb to go over lab results 

## 2016-11-15 NOTE — Patient Instructions (Addendum)
Medication Instructions:  1. STOP METOPROLOL TARTRATE  2. START TOPROL XL 25 MG TABLET WITH THE DIRECTIONS ON BOTTLE TO READ : TAKE 1/2 TABLET = 12.5 MG ONCE A DAY  3. INCREASE LISINOPRIL 5 MG DAILY  Labwork: TODAY BMET  12/31/16 @ 7:30 FASTING LIPID AND CMET  Testing/Procedures: NONE ORDERED TODAY  Follow-Up: 01/27/17 @ 12 PM DR. Excell SeltzerOOPER   Any Other Special Instructions Will Be Listed Below (If Applicable).     If you need a refill on your cardiac medications before your next appointment, please call your pharmacy.

## 2016-11-19 NOTE — Telephone Encounter (Signed)
-----   Message from Scott T Weaver, PA-C sent at 11/15/2016  5:00 PM EDT ----- Please call patient: The kidney function (BUN, Creatinine) and potassium are normal. All other parameters are within acceptable limits and no further intervention or testing required. Continue with current treatment plan. Scott Weaver, PA-C    11/15/2016 5:00 PM 

## 2016-11-19 NOTE — Telephone Encounter (Signed)
Lmtcb x 2 to go over lab results 

## 2016-11-22 NOTE — Telephone Encounter (Signed)
Pt has been notified of lab results by phone with verbal understanding. Pt thanked me for my call today.   

## 2016-11-22 NOTE — Telephone Encounter (Signed)
-----   Message from Scott T Weaver, PA-C sent at 11/15/2016  5:00 PM EDT ----- Please call patient: The kidney function (BUN, Creatinine) and potassium are normal. All other parameters are within acceptable limits and no further intervention or testing required. Continue with current treatment plan. Scott Weaver, PA-C    11/15/2016 5:00 PM 

## 2016-12-10 ENCOUNTER — Telehealth (HOSPITAL_COMMUNITY): Payer: Self-pay

## 2016-12-10 NOTE — Telephone Encounter (Signed)
I called and left message on patient voicemail to return call about cardiac rehab. I left office contact information on voicemail to return call. I called patient to discuss insurance benefits.

## 2016-12-10 NOTE — Telephone Encounter (Signed)
Patient returned my call and left message to return call. I called patient and left message on patient voicemail to return call. I left my office contact information on voicemail.

## 2016-12-11 ENCOUNTER — Telehealth (HOSPITAL_COMMUNITY): Payer: Self-pay

## 2016-12-11 NOTE — Telephone Encounter (Signed)
Patient returned my call. Patient will call me back after discussing cardiac rehab with his wife. Patient has a 50% co-insurance since we are not a [facility] facility.

## 2016-12-31 ENCOUNTER — Other Ambulatory Visit: Payer: PRIVATE HEALTH INSURANCE

## 2016-12-31 ENCOUNTER — Telehealth: Payer: Self-pay | Admitting: *Deleted

## 2016-12-31 DIAGNOSIS — E785 Hyperlipidemia, unspecified: Secondary | ICD-10-CM

## 2016-12-31 DIAGNOSIS — I10 Essential (primary) hypertension: Secondary | ICD-10-CM

## 2016-12-31 LAB — COMPREHENSIVE METABOLIC PANEL
ALT: 31 IU/L (ref 0–44)
AST: 26 IU/L (ref 0–40)
Albumin/Globulin Ratio: 1.2 (ref 1.2–2.2)
Albumin: 4.1 g/dL (ref 3.6–4.8)
Alkaline Phosphatase: 81 IU/L (ref 39–117)
BILIRUBIN TOTAL: 0.5 mg/dL (ref 0.0–1.2)
BUN / CREAT RATIO: 13 (ref 10–24)
BUN: 14 mg/dL (ref 8–27)
CALCIUM: 9.6 mg/dL (ref 8.6–10.2)
CHLORIDE: 101 mmol/L (ref 96–106)
CO2: 24 mmol/L (ref 20–29)
CREATININE: 1.09 mg/dL (ref 0.76–1.27)
GFR calc non Af Amer: 70 mL/min/{1.73_m2} (ref >59)
GFR, EST AFRICAN AMERICAN: 81 mL/min/{1.73_m2} (ref >59)
GLOBULIN, TOTAL: 3.3 g/dL (ref 1.5–4.5)
Glucose: 102 mg/dL — ABNORMAL HIGH (ref 65–99)
Potassium: 4.3 mmol/L (ref 3.5–5.2)
Sodium: 139 mmol/L (ref 134–144)
Total Protein: 7.4 g/dL (ref 6.0–8.5)

## 2016-12-31 LAB — LIPID PANEL
CHOLESTEROL TOTAL: 96 mg/dL — AB (ref 100–199)
Chol/HDL Ratio: 2.4 ratio (ref 0.0–5.0)
HDL: 40 mg/dL (ref >39)
LDL CALC: 46 mg/dL (ref 0–99)
TRIGLYCERIDES: 49 mg/dL (ref 0–149)
VLDL Cholesterol Cal: 10 mg/dL (ref 5–40)

## 2016-12-31 NOTE — Telephone Encounter (Signed)
[facility] lab work ok. Continue on current Tx plan. Any questions please call [phone]. Pt has appt with DR. Cooper 01/27/17

## 2016-12-31 NOTE — Telephone Encounter (Signed)
-----   Message from Beatrice LecherScott T Weaver, [place] sent at 12/31/2016  4:53 PM EDT ----- Please call the patient Lipids are optimal.  Renal function, liver enzymes normal. Continue current medications and follow up as planned.  Tereso NewcomerScott Weaver, PA-C 12/31/2016 4:53 PM

## 2017-01-01 ENCOUNTER — Telehealth: Payer: Self-pay | Admitting: Physician Assistant

## 2017-01-01 NOTE — Telephone Encounter (Signed)
New message ° ° ° ° °Patient returning call for results. Please call °

## 2017-01-01 NOTE — Telephone Encounter (Signed)
Ptcb to go over lab results. DPR ok to lmom,.

## 2017-01-03 NOTE — Telephone Encounter (Signed)
Pt has been notified of lab results by phone with verbal understanding. I will mail out results to pt. Pt address was verified.

## 2017-01-27 ENCOUNTER — Encounter: Payer: Self-pay | Admitting: Cardiovascular Disease

## 2017-01-27 ENCOUNTER — Ambulatory Visit (INDEPENDENT_AMBULATORY_CARE_PROVIDER_SITE_OTHER): Payer: PRIVATE HEALTH INSURANCE | Admitting: Cardiovascular Disease

## 2017-01-27 VITALS — BP 142/80 | HR 56 | Ht 69.0 in | Wt 186.4 lb

## 2017-01-27 DIAGNOSIS — E785 Hyperlipidemia, unspecified: Secondary | ICD-10-CM | POA: Diagnosis not present

## 2017-01-27 DIAGNOSIS — I10 Essential (primary) hypertension: Secondary | ICD-10-CM | POA: Diagnosis not present

## 2017-01-27 DIAGNOSIS — I2510 Atherosclerotic heart disease of native coronary artery without angina pectoris: Secondary | ICD-10-CM | POA: Diagnosis not present

## 2017-01-27 MED ORDER — TICAGRELOR 90 MG PO TABS
90.0000 mg | ORAL_TABLET | Freq: Two times a day (BID) | ORAL | 3 refills | Status: DC
Start: 2017-01-27 — End: 2017-07-11

## 2017-01-27 MED ORDER — LISINOPRIL 5 MG PO TABS: 5.0000 mg | ORAL_TABLET | Freq: Every day | ORAL | 3 refills | Status: DC

## 2017-01-27 MED ORDER — METOPROLOL SUCCINATE ER 25 MG PO TB24: 12.5000 mg | ORAL_TABLET | Freq: Every day | ORAL | 3 refills | Status: DC

## 2017-01-27 MED ORDER — AMLODIPINE BESYLATE 5 MG PO TABS: 5.0000 mg | ORAL_TABLET | Freq: Every day | ORAL | 3 refills | Status: DC

## 2017-01-27 NOTE — Patient Instructions (Signed)
Medication Instructions:  Your provider recommends that you continue on your current medications as directed. Please refer to the Current Medication list given to you today.    Labwork: None  Testing/Procedures: None  Follow-Up: Your provider wants you to follow-up in: 6 months with Dr. Cooper's assistant, Scott. You will receive a reminder letter in the mail two months in advance. If you don't receive a letter, please call our office to schedule the follow-up appointment.    Any Other Special Instructions Will Be Listed Below (If Applicable).     If you need a refill on your cardiac medications before your next appointment, please call your pharmacy.   

## 2017-01-27 NOTE — Progress Notes (Signed)
Cardiology Office Note Date:  01/27/2017   ID:  Joshua Kirk, DOB 1950-11-07, MRN [id number]  PCP:  Lucianne Lei, MD  Cardiologist:  Janne Lab, MD    Chief Complaint  Patient presents with  . Coronary Artery Disease    follow up     History of Present Illness: Joshua Kirk is a 66 y.o. male who presents for follow-up of CAD.  The patient initially presented in August 2018 with a non-STEMI.  He was found to have total occlusion of the left circumflex and underwent primary PCI.  He then underwent staged PCI of the mid LAD.  He was treated with drug-eluting stent platforms in both vessels.  Echo demonstrated normal LV systolic function.  The patient is doing well.  He denies symptoms of chest pain, shortness of breath, or heart palpitations.  He has no complaints today.  He is tolerating his medications well.   Past Medical History:  Diagnosis Date  . CAD (coronary artery disease)    NSTEMI 8/18: mLAD 75, dLAD 75, pLCx 100 >> PCI: DES to pLCx // staged PCI 8/18: DES to mLAD // Echo 8/18: Mod conc LVH, vigorous LVF, EF 65-70, no RWMA, Gr 1 DD, mild AI, mild MR  . Essential hypertension 11/14/2016  . Hyperlipidemia 11/14/2016  . Hypertension   . NSTEMI (non-ST elevated myocardial infarction) ([facility]) 11/03/2016    Past Surgical History:  Procedure Laterality Date  . CORONARY STENT INTERVENTION N/A 11/05/2016   Performed by Leonie Man, MD at [facility]  . CORONARY STENT INTERVENTION N/A 11/03/2016   Performed by Sherren Mocha, MD at [facility]  . LEFT HEART CATH AND CORONARY ANGIOGRAPHY N/A 11/03/2016   Performed by Sherren Mocha, MD at [facility]    Current Outpatient Medications  Medication Sig Dispense Refill  . aspirin EC 81 MG tablet Take 81 mg by mouth daily.    Marland Kitchen atorvastatin (LIPITOR) 80 MG tablet Take 1 tablet (80 mg total) by mouth daily at 6 PM. 80 tablet 11  . nitroGLYCERIN (NITROSTAT) 0.4 MG SL tablet Place 1 tablet (0.4  mg total) under the tongue every 5 (five) minutes as needed for chest pain. 25 tablet 12  . amLODipine (NORVASC) 5 MG tablet Take 1 tablet (5 mg total) daily by mouth. 90 tablet 3  . lisinopril (PRINIVIL,ZESTRIL) 5 MG tablet Take 1 tablet (5 mg total) daily by mouth. 90 tablet 3  . metoprolol succinate (TOPROL XL) 25 MG 24 hr tablet Take 0.5 tablets (12.5 mg total) daily by mouth. 90 tablet 3  . ticagrelor (BRILINTA) 90 MG TABS tablet Take 1 tablet (90 mg total) 2 (two) times daily by mouth. 180 tablet 3   No current facility-administered medications for this visit.     Allergies:   Patient has no known allergies.   Social History:  The patient  reports that  has never smoked. he has never used smokeless tobacco. He reports that he drinks alcohol.   Family History:  The patient's Family history is unknown by patient.    ROS:  Please see the history of present illness.  All other systems are reviewed and negative.    PHYSICAL EXAM: VS:  BP (!) 142/80   Pulse (!) 56   Ht 5' 9"  (1.753 m)   Wt 186 lb 6.4 oz (84.6 kg)   BMI 27.53 kg/m  , BMI Body mass index is 27.53 kg/m. GEN: Well nourished, well developed, in  no acute distress  HEENT: normal  Neck: no JVD, no masses. No carotid bruits Cardiac: RRR without murmur or gallop     Respiratory:  clear to auscultation bilaterally, normal work of breathing GI: soft, nontender, nondistended, + BS MS: no deformity or atrophy  Ext: no pretibial edema, pedal pulses 2+= bilaterally Skin: warm and dry, no rash Neuro:  Strength and sensation are intact Psych: euthymic mood, full affect  EKG:  EKG is not ordered today.  Recent Labs: 11/06/2016: Hemoglobin 13.0; Platelets 139 12/31/2016: ALT 31; BUN 14; Creatinine, Ser 1.09; Potassium 4.3; Sodium 139   Lipid Panel     Component Value Date/Time   CHOL 96 (L) 12/31/2016 0947   TRIG 49 12/31/2016 0947   HDL 40 12/31/2016 0947   CHOLHDL 2.4 12/31/2016 0947   CHOLHDL 4.2 11/05/2016 0250    VLDL 19 11/05/2016 0250   LDLCALC 46 12/31/2016 0947      Wt Readings from Last 3 Encounters:  01/27/17 186 lb 6.4 oz (84.6 kg)  11/15/16 188 lb 12.8 oz (85.6 kg)  11/05/16 184 lb 12.8 oz (83.8 kg)     Cardiac Studies Reviewed: Cardiac Cath 11-03-2016: Conclusion   1. Non-STEMI secondary to total occlusion of the left circumflex, treated with PCI using a 2.25 x 20 mm Synergy DES. PCI procedure is complex related to a fibrocalcific lesion that was difficult to cross with a stent. The procedure required prolonged fluoroscopy, multiple balloon predilatation's, a buddy wire technique, and angioplasty of different subbranches. 2. Residual severe stenosis in the proximal to mid LAD at the level of the first septal perforator 3. Widely patent RCA, subselectively injected 4. Normal LVEDP 5. Left ventriculography is deferred because of high contrast volume  Recommendations:   Dual antiplatelet therapy with aspirin and brilinta 12 months minimum.  Staged PCI of the LAD prior to discharge, possibly tomorrow if patient stable and renal function is within limits  Assessment of LV function with echo  Initiate goal-directed medical therapy with a beta blocker and high intensity statin drug  Indications   Non-ST elevation (NSTEMI) myocardial infarction ([facility]) [I21.4 (ICD-10-CM)]  Procedural Details/Technique   Technical Details INDICATION: NSTEMI with ongoing ischemic sx's. 66 year old gentleman with no past cardiac history presented with chest discomfort and ruled in for non-STEMI. He continued to have chest pain despite medical therapy and is referred for urgent cardiac catheterization and PCI.  PROCEDURAL DETAILS: The right wrist was prepped, draped, and anesthetized with 1% lidocaine. Using the modified Seldinger technique, a 5/6 French Slender sheath was introduced into the right radial artery. 3 mg of verapamil was administered through the sheath, weight-based unfractionated heparin was  administered intravenously. The right subclavian artery is very tortuous and it takes a 360 loop. Standard Judkins catheters were used for selective coronary angiography. Coronary angiography was difficult cousin of subclavian tortuosity. The right coronary was very difficult to selectively engage. The vessel was ultimately imaged with a JR 5 guide catheter nonselectively. There is a high origin of the vessel. The left circumflex is acutely occluded and it is the patient's culprit vessel. A 6 French EBU 3.5 cm guide catheter is used. Heparin is used for anticoagulation. The patient is loaded with brilinta 180 mg on the table. See the PCI note for further details. Catheter exchanges were performed over an exchange length guidewire. There were no immediate procedural complications. A TR band was used for radial hemostasis at the completion of the procedure. The patient was transferred to the post catheterization recovery  area for further monitoring.    Estimated blood loss <50 mL.  During this procedure the patient was administered the following to achieve and maintain moderate conscious sedation: Versed 3 mg, Fentanyl 25 mcg, while the patient's heart rate, blood pressure, and oxygen saturation were continuously monitored. The period of conscious sedation was 123 minutes, of which I was present face-to-face 100% of this time.  Coronary Findings   Diagnostic  Dominance: Right  Left Anterior Descending  Prox LAD to Mid LAD lesion 75% stenosed  The lesion is eccentric. there is an eccentric 75 percent stenosis of the proximal LAD just at the level of the first septal perforator branch. The area is hypodense.  Dist LAD lesion 75% stenosed  Severely stenotic lesion near the apex  Ramus Intermedius  There is mild the vessel.  Left Circumflex  Prox Cx lesion 100% stenosed  The lesion is thrombotic. The lesion is moderately calcified.  Right Coronary Artery  The vessel exhibits minimal luminal  irregularities. The vessel was imaged nonselectively. There is a high origin near the left cusp. There is no significant stenosis visualized.  Intervention   Prox Cx lesion  Angioplasty  A STENT SYNERGY DES 2.25X20 drug eluting stent was successfully placed. Post-stent angioplasty was performed using a BALLOON SAPPHIRE [place] 2.5X15. Maximum pressure: 16 atm. There is no pre-interventional antegrade distal flow (TIMI 0). The post-interventional distal flow is normal (TIMI 3). No complications occurred at this lesion. A cougar wire is used to cross the lesion. The lesion was initially treated with a 2.0 mm balloon. This restored TIMI-3 flow. There was segmental severe stenosis beyond the area of occlusion. I tried to advance the 2.0 mm balloon beyond that but it would not cross. A new 2.0 mm balloon was advanced across the second lesion and dilated to 10 atm. The balloon appeared well expanded. A 2.25 x 20 mm Synergy drug-eluting stent was advanced but would not cross the second lesion. A prolonged attempt was made. The lesion was then redilated multiple times with various balloons. A 2.25 mm noncompliant balloon was used and dilated to 12 atm. The stent still would not cross. A grand slam buddy wire was advanced in the stent would still not cross with the buddy wire in place. I then rewired and OM subbranch with a second cougar wire and dilated the lesion with a 2 mm balloon. I tried to advance the stent while pulling back on the subbranch balloon but this did not help the stent cross. As a final attempt, a 2.5 x 20 mm noncompliant balloon was dilated to 12 atm over the lesions. This clearly demonstrated a residual waist in the balloon. The balloon was then taken up further to 14 atm and this finally helped the lesion yielded. I was unable to advance the stent in the proper position and it was deployed at 14 atm. The stent was postdilated with a 2.5 mm noncompliant balloon to 16 atm. Completion of the procedure  there was 0% residual stenosis and TIMI-3 flow.  There is no residual stenosis post intervention.  Coronary Diagrams   Diagnostic Diagram       Post-Intervention Diagram        Cardiac Cath 11-05-2016: Conclusion     Mid LAD lesion, 75 %stenosed.  A STENT SYNERGY DES 3.5X12 drug eluting stent was successfully placed. Postdilated tapered fashion from 4.1-3.8 mm.  Post intervention, there is a 0% residual stenosis.  Prox Cx DES Stent (from 8/25), 0 %stenosed.  Dist LAD  lesion, 75 %stenosed. Med Rx - medical management   Successful early mid LAD focal PCI with DES stent.  Plan:   Return to nursing unit for ongoing care TR band removal.  Expect that he should be ready for discharge tomorrow.  Continue aggressive risk factor modification and goal-directed care with beta blocker and statin along with dual antiplatelet therapy.    Echo 11-04-2016: Study Conclusions  - Left ventricle: The cavity size was normal. There was moderate   concentric hypertrophy. Systolic function was vigorous. The   estimated ejection fraction was in the range of 65% to 70%. Wall   motion was normal; there were no regional wall motion   abnormalities. Doppler parameters are consistent with abnormal   left ventricular relaxation (grade 1 diastolic dysfunction).   There was no evidence of elevated ventricular filling pressure by   Doppler parameters. - Aortic valve: Trileaflet; mildly thickened, mildly calcified   leaflets. There was mild regurgitation. - Mitral valve: Structurally normal valve. There was mild   regurgitation. - Right ventricle: The cavity size was normal. Wall thickness was   normal. Systolic function was normal. - Right atrium: The atrium was normal in size. - Tricuspid valve: There was trivial regurgitation. - Pulmonic valve: There was no regurgitation. - Pulmonary arteries: Systolic pressure was within the normal   range. - Inferior vena cava: The vessel was normal  in size. - Pericardium, extracardiac: There was no pericardial effusion.  ASSESSMENT AND PLAN: 1.  CAD, native vessel, without angina: The patient appears to be doing quite well on his current medical program.  No changes are made today.  2. HTN: Continue amlodipine 5 mg, lisinopril 5 mg, and metoprolol succinate 12.5 mg daily.  Resting bradycardia will not allow for increasing eta blocker dose.  States home blood pressure readings are averaging in the low 130s over 70s.  3. Hyperlipidemia: Lipids reviewed and are excellent.  Continue atorvastatin 80 mg daily.  LFTs are within normal limits.  Cath, PCI, and echo studies are reviewed as part of today's evaluation.   Current medicines are reviewed with the patient today.  The patient does not have concerns regarding medicines.  Labs/ tests ordered today include:  No orders of the defined types were placed in this encounter.   Disposition:   FU 6 months with APP, one year with me  Signed, Janne Lab, MD  01/27/2017 1:15 PM    [facility] [place], [place], [place]  [address] Phone: [phone]; Fax: [phone]

## 2017-06-30 ENCOUNTER — Telehealth: Payer: Self-pay | Admitting: Physician Assistant

## 2017-06-30 NOTE — Telephone Encounter (Signed)
I received message from operator who states pt asking for sooner appt than the 07/16/17 appt. Pt going out of the country on 07/17/17. I sent back to the operator she could schedule pt on 07/11/17 @ 11:45. I saw the pt was now scheduled for August 14, 2017. I then called the myself to verify that the new appt was correct especially since he requested a sooner apopt than 5/8. I explained to the pt that I had offered to the operator a Jul 11, 2017 @ 11:45 with Tereso NewcomerScott Weaver, PA. Pt is agreeable to the Jul 11, 2017 @ 11:45 appt with PA.

## 2017-06-30 NOTE — Telephone Encounter (Signed)
Pt can be seen Jul 11, 2017 @ 11:45. I will send message to scheduler to call the pt and schedule appt.

## 2017-06-30 NOTE — Telephone Encounter (Signed)
New Message:     Pt states he calling for a sooner appt because he is about to go out of the country and would like to be seen before 5/8 because he is leaving on 5/9.

## 2017-07-11 ENCOUNTER — Encounter: Payer: Self-pay | Admitting: Physician Assistant

## 2017-07-11 ENCOUNTER — Ambulatory Visit (INDEPENDENT_AMBULATORY_CARE_PROVIDER_SITE_OTHER): Payer: PRIVATE HEALTH INSURANCE | Admitting: Physician Assistant

## 2017-07-11 VITALS — BP 124/80 | Ht 68.0 in | Wt 177.0 lb

## 2017-07-11 DIAGNOSIS — I10 Essential (primary) hypertension: Secondary | ICD-10-CM | POA: Diagnosis not present

## 2017-07-11 DIAGNOSIS — I25119 Atherosclerotic heart disease of native coronary artery with unspecified angina pectoris: Secondary | ICD-10-CM | POA: Diagnosis not present

## 2017-07-11 DIAGNOSIS — E785 Hyperlipidemia, unspecified: Secondary | ICD-10-CM | POA: Diagnosis not present

## 2017-07-11 MED ORDER — LISINOPRIL 5 MG PO TABS: 5.0000 mg | ORAL_TABLET | Freq: Every day | ORAL | 3 refills | Status: DC

## 2017-07-11 MED ORDER — AMLODIPINE BESYLATE 10 MG PO TABS: 10.0000 mg | ORAL_TABLET | Freq: Every day | ORAL | 3 refills | Status: DC

## 2017-07-11 MED ORDER — ATORVASTATIN CALCIUM 80 MG PO TABS: 80.0000 mg | ORAL_TABLET | Freq: Every day | ORAL | 3 refills | Status: DC

## 2017-07-11 MED ORDER — NITROGLYCERIN 0.4 MG SL SUBL: 0.4000 mg | SUBLINGUAL_TABLET | SUBLINGUAL | 12 refills | Status: AC | PRN

## 2017-07-11 MED ORDER — METOPROLOL SUCCINATE ER 25 MG PO TB24: 12.5000 mg | ORAL_TABLET | Freq: Every day | ORAL | 3 refills | Status: DC

## 2017-07-11 MED ORDER — TICAGRELOR 90 MG PO TABS: 90.0000 mg | ORAL_TABLET | Freq: Two times a day (BID) | ORAL | 3 refills | Status: DC

## 2017-07-11 NOTE — Progress Notes (Signed)
Cardiology Office Note:    Date:  07/11/2017   ID:  Joshua Kirk, DOB October 22, 1950, MRN [id number]  PCP:  Renaye Rakers, MD  Cardiologist:  Tonny Bollman, MD   Referring MD: Renaye Rakers, MD   Chief Complaint  Patient presents with  . Follow-up    CAD    History of Present Illness:    Joshua Kirk is a 67 y.o. male with coronary artery disease status post non-ST elevation myocardial infarction in August 2018 treated with DES to the LCx and staged intervention of the LAD with a DES.  EF was preserved by echocardiogram.  Last seen by Dr. Excell Seltzer November 2018.  Joshua Kirk returns for follow-up on coronary disease.  He is here today with his wife.  He is traveling to [place] in the next few weeks.  He will be there for approximately 1 month.  He needs his medicines refilled to last him while he is gone.  His wife is starting an internal medicine residency in [place] next month.  He has been doing well.  He denies chest pain, shortness of breath, syncope, PND, edema.  His wife did increase his amlodipine from 5 to 10 mg due to uncontrolled blood pressure.  Prior CV studies:   The following studies were reviewed today:  Cardiac Catheterization/PCI 11/05/16 PCI: 3.5x12 Synergy DES to mid LAD  Cardiac Catheterization 11/03/16 LAD mid 75, dist 75 RI mild disease LCx prox 100 RCA irregs PCI: 2.25 x 20 mm Synergy DES to prox LCx  Echocardiogram 11/04/16 Mod conc LVH, vigorous LVF, EF 65-70, no RWMA, Gr 1 DD, mild AI, mild MR  Past Medical History:  Diagnosis Date  . CAD (coronary artery disease)    NSTEMI 8/18: mLAD 75, dLAD 75, pLCx 100 >> PCI: DES to pLCx // staged PCI 8/18: DES to mLAD // Echo 8/18: Mod conc LVH, vigorous LVF, EF 65-70, no RWMA, Gr 1 DD, mild AI, mild MR  . Essential hypertension 11/14/2016  . Hyperlipidemia 11/14/2016  . Hypertension   . NSTEMI (non-ST elevated myocardial infarction) ([facility]) 11/03/2016   Surgical Hx: The patient  has a past surgical  history that includes LEFT HEART CATH AND CORONARY ANGIOGRAPHY (N/A, 11/03/2016); CORONARY STENT INTERVENTION (N/A, 11/03/2016); and CORONARY STENT INTERVENTION (N/A, 11/05/2016).   Current Medications: Current Meds  Medication Sig  . amLODipine (NORVASC) 10 MG tablet Take 1 tablet (10 mg total) by mouth daily.  Marland Kitchen aspirin EC 81 MG tablet Take 81 mg by mouth daily.  Marland Kitchen atorvastatin (LIPITOR) 80 MG tablet Take 1 tablet (80 mg total) by mouth daily at 6 PM.  . lisinopril (PRINIVIL,ZESTRIL) 5 MG tablet Take 1 tablet (5 mg total) by mouth daily.  . nitroGLYCERIN (NITROSTAT) 0.4 MG SL tablet Place 1 tablet (0.4 mg total) under the tongue every 5 (five) minutes as needed for chest pain.  . ticagrelor (BRILINTA) 90 MG TABS tablet Take 1 tablet (90 mg total) by mouth 2 (two) times daily.     Allergies:   Patient has no known allergies.   Social History   Tobacco Use  . Smoking status: Never Smoker  . Smokeless tobacco: Never Used  Substance Use Topics  . Alcohol use: Yes    Comment: Occasionally  . Drug use: Not on file     Family Hx: The patient's Family history is unknown by patient.  ROS:   Please see the history of present illness.    ROS All other systems reviewed and are negative.  EKGs/Labs/Other Test Reviewed:    EKG:  EKG is  ordered today.  The ekg ordered today demonstrates sinus bradycardia, HR 50, left axis deviation, LVH with repolarization abnormality, similar to prior tracings, QTC 408  Recent Labs: 11/06/2016: Hemoglobin 13.0; Platelets 139 12/31/2016: ALT 31; BUN 14; Creatinine, Ser 1.09; Potassium 4.3; Sodium 139   Recent Lipid Panel Lab Results  Component Value Date/Time   CHOL 96 (L) 12/31/2016 09:47 AM   TRIG 49 12/31/2016 09:47 AM   HDL 40 12/31/2016 09:47 AM   CHOLHDL 2.4 12/31/2016 09:47 AM   CHOLHDL 4.2 11/05/2016 02:50 AM   LDLCALC 46 12/31/2016 09:47 AM    Physical Exam:    VS:  BP 124/80 (BP Location: Right Arm, Patient Position: Sitting, Cuff  Size: Normal)   Ht  (1.727 m)   Wt 177 lb (80.3 kg)   BMI 26.91 kg/m     Wt Readings from Last 3 Encounters:  07/11/17 177 lb (80.3 kg)  01/27/17 186 lb 6.4 oz (84.6 kg)  11/15/16 188 lb 12.8 oz (85.6 kg)     Physical Exam  Constitutional: He is oriented to person, place, and time. He appears well-developed and well-nourished. No distress.  HENT:  Head: Normocephalic and atraumatic.  Neck: Neck supple. No JVD present.  Cardiovascular: Normal rate, regular rhythm, S1 normal and S2 normal.  Murmur heard.  Low-pitched midsystolic murmur is present with a grade of 2/6 at the upper right sternal border. Pulmonary/Chest: Breath sounds normal. He has no rales.  Abdominal: Soft. There is no hepatomegaly.  Musculoskeletal: He exhibits no edema.  Neurological: He is alert and oriented to person, place, and time.  Skin: Skin is warm and dry.    ASSESSMENT & PLAN:    Coronary artery disease involving native coronary artery of native heart with angina pectoris [facility])  History of non-ST elevation myocardial infarction August 2018 treated with drug-eluting stent to the LCx and staged intervention of the LAD with a drug-eluting stent.  EF is preserved.  He is doing well without chest discomfort or shortness of breath.  Continue current medical regimen.  He is traveling to [place] soon and will be gone for a month.  I will refill all of his medication so he can get a 90-day refill and not run out while he is gone.  Follow-up with Dr. Excell Seltzer in November.  Consider discontinuing Brilinta at that time.  Essential hypertension The patient's blood pressure is controlled on his current regimen.  Continue current therapy.  Plan repeat BMET prior to next visit.  Hyperlipidemia, unspecified hyperlipidemia type  LDL optimal on most recent lab work.  Continue current Rx.  Plan follow-up lipids and LFTs prior to next visit.  Dispo:  Return in about 6 months (around 01/11/2018) for Routine Follow Up, w/  Dr. Excell Seltzer.   Medication Adjustments/Labs and Tests Ordered: Current medicines are reviewed at length with the patient today.  Concerns regarding medicines are outlined above.  Tests Ordered: Orders Placed This Encounter  Procedures  . Basic Metabolic Panel (BMET)  . Hepatic function panel  . Lipid Profile  . EKG 12-Lead   Medication Changes: Meds ordered this encounter  Medications  . amLODipine (NORVASC) 10 MG tablet    Sig: Take 1 tablet (10 mg total) by mouth daily.    Dispense:  90 tablet    Refill:  3    PT TRAVELING OUT OF THE COUNTRY; WILL NEED REFILLS SOONER  . atorvastatin (LIPITOR) 80 MG tablet  Sig: Take 1 tablet (80 mg total) by mouth daily at 6 PM.    Dispense:  90 tablet    Refill:  3    PT TRAVELING OUT OF THE COUNTRY; WILL NEED REFILLS SOONER  . lisinopril (PRINIVIL,ZESTRIL) 5 MG tablet    Sig: Take 1 tablet (5 mg total) by mouth daily.    Dispense:  90 tablet    Refill:  3    PT TRAVELING OUT OF THE COUNTRY; WILL NEED REFILLS SOONER  . nitroGLYCERIN (NITROSTAT) 0.4 MG SL tablet    Sig: Place 1 tablet (0.4 mg total) under the tongue every 5 (five) minutes as needed for chest pain.    Dispense:  25 tablet    Refill:  12    PT TRAVELING OUT OF THE COUNTRY; WILL NEED REFILLS SOONER  . ticagrelor (BRILINTA) 90 MG TABS tablet    Sig: Take 1 tablet (90 mg total) by mouth 2 (two) times daily.    Dispense:  180 tablet    Refill:  3    PT TRAVELING OUT OF THE COUNTRY; WILL NEED REFILLS SOONER    Signed, Tereso Newcomer, PA-C  07/11/2017 12:39 PM    [facility] [address] [place], [place], [place]  [address] Phone: [phone]; Fax: [phone]

## 2017-07-11 NOTE — Patient Instructions (Signed)
Medication Instructions:  1. REFILLS HAVE BEEN SENT I FOR 90 DAY SUPPLY WITH 3 REFILLS   Labwork: THIS IS TO BE DONE 1 WEEK BEFORE YOU SEE DR. Excell Seltzer IN November 2019 (BMET, LIPID AND LIVER) THIS LAB WORK WILL NEED TO BE FASTING. NOTHING TO EAT AFTER MIDNIGHT THE NIGHT BEFORE LAB WORK, MORNING MEDICATIONS WITH WATER IS FINE  Testing/Procedures: NONE ORDERED TODAY  Follow-Up: DR. Excell Seltzer 01/2018; WE WILL SEND OUT A REMINDER LETTER A COUPLE OF MONTHS EARLIER TO HAVE YOU CALL AND MAKE AN APPT   Any Other Special Instructions Will Be Listed Below (If Applicable).     If you need a refill on your cardiac medications before your next appointment, please call your pharmacy.

## 2017-07-15 ENCOUNTER — Ambulatory Visit: Payer: PRIVATE HEALTH INSURANCE | Admitting: Physician Assistant

## 2017-07-29 ENCOUNTER — Ambulatory Visit: Payer: PRIVATE HEALTH INSURANCE | Admitting: Physician Assistant

## 2017-08-18 ENCOUNTER — Encounter

## 2017-08-18 ENCOUNTER — Ambulatory Visit: Payer: PRIVATE HEALTH INSURANCE | Admitting: Physician Assistant

## 2017-11-04 ENCOUNTER — Telehealth: Payer: Self-pay | Admitting: Physician Assistant

## 2017-11-04 ENCOUNTER — Telehealth: Payer: Self-pay | Admitting: Cardiovascular Disease

## 2017-11-04 NOTE — Telephone Encounter (Signed)
We got a fax today from [employer]. This fax has been give to [facility] HealthcareScott Weaver, [place]. I will forward this phone note as well to PA.

## 2017-11-04 NOTE — Telephone Encounter (Signed)
Walk In pt form-pt needs letter. Placed in [employer]Scott Weaver doc box.

## 2017-11-04 NOTE — Telephone Encounter (Signed)
New message    Patient needs a letter faxed to [facility] [employer] stating that he is healthy enough to do job , wearing a respirator   Fax [phone] -patient signed release at front desk

## 2017-11-07 ENCOUNTER — Encounter: Payer: Self-pay | Admitting: Physician Assistant

## 2017-11-07 NOTE — Telephone Encounter (Signed)
Spoke with patient.  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  He must wear protective clothing and a respirator.  He thinks the environment may be hot.  I have provided a letter that outlines he should have frequent breaks to avoid prolonged exposure to heat.  He may wear a respirator.  I explained this to the patient.    Please fax note to the appropriate party. Tereso NewcomerScott Asna Muldrow, PA-C    11/07/2017 1:17 PM

## 2017-11-07 NOTE — Telephone Encounter (Signed)
Done

## 2018-05-04 ENCOUNTER — Encounter: Payer: Self-pay | Admitting: Cardiovascular Disease

## 2018-05-04 ENCOUNTER — Ambulatory Visit (INDEPENDENT_AMBULATORY_CARE_PROVIDER_SITE_OTHER): Payer: 59 | Admitting: Cardiovascular Disease

## 2018-05-04 VITALS — BP 138/86 | HR 61 | Ht 68.0 in | Wt 186.2 lb

## 2018-05-04 DIAGNOSIS — E782 Mixed hyperlipidemia: Secondary | ICD-10-CM | POA: Diagnosis not present

## 2018-05-04 DIAGNOSIS — I10 Essential (primary) hypertension: Secondary | ICD-10-CM | POA: Diagnosis not present

## 2018-05-04 DIAGNOSIS — I2510 Atherosclerotic heart disease of native coronary artery without angina pectoris: Secondary | ICD-10-CM | POA: Diagnosis not present

## 2018-05-04 MED ORDER — LISINOPRIL 5 MG PO TABS: 5.0000 mg | ORAL_TABLET | Freq: Every day | ORAL | 3 refills | Status: AC

## 2018-05-04 MED ORDER — METOPROLOL SUCCINATE ER 25 MG PO TB24: 12.5000 mg | ORAL_TABLET | Freq: Every day | ORAL | 3 refills | Status: DC

## 2018-05-04 MED ORDER — AMLODIPINE BESYLATE 10 MG PO TABS: 10.0000 mg | ORAL_TABLET | Freq: Every day | ORAL | 3 refills | Status: DC

## 2018-05-04 NOTE — Patient Instructions (Signed)
Medication Instructions:  1) STOP BRILINTA  Labwork: None  Testing/Procedures: None  Follow-Up: Your provider wants you to follow-up in: 1 year with Dr. Excell Seltzer. You will receive a reminder letter in the mail two months in advance. If you don't receive a letter, please call our office to schedule the follow-up appointment.    Any Other Special Instructions Will Be Listed Below (If Applicable).     If you need a refill on your cardiac medications before your next appointment, please call your pharmacy.

## 2018-05-04 NOTE — Progress Notes (Signed)
Cardiology Office Note:    Date:  05/04/2018   ID:  Joshua Kirk, DOB September 16, 1950, MRN [id number]  PCP:  Renaye Rakers, MD  Cardiologist:  Tonny Bollman, MD  Electrophysiologist:  None   Referring MD: Renaye Rakers, MD   Chief Complaint  Patient presents with  . Coronary Artery Disease   History of Present Illness:    Joshua Kirk is a 68 y.o. male with a hx of coronary artery disease, presenting for follow-up evaluation.  The patient presented in 2018 with non-STEMI.  He is here alone today. Doing very well, exercising regularly without exertional symptoms. Today, he denies symptoms of palpitations, chest pain, shortness of breath, orthopnea, PND, lower extremity edema, dizziness, or syncope. He's compliant with DAPT without bleeding.   Past Medical History:  Diagnosis Date  . CAD (coronary artery disease)    NSTEMI 8/18: mLAD 75, dLAD 75, pLCx 100 >> PCI: DES to pLCx // staged PCI 8/18: DES to mLAD // Echo 8/18: Mod conc LVH, vigorous LVF, EF 65-70, no RWMA, Gr 1 DD, mild AI, mild MR  . Essential hypertension 11/14/2016  . Hyperlipidemia 11/14/2016  . Hypertension   . NSTEMI (non-ST elevated myocardial infarction) (HCC) 11/03/2016    Past Surgical History:  Procedure Laterality Date  . CORONARY STENT INTERVENTION N/A 11/03/2016   Procedure: CORONARY STENT INTERVENTION;  Surgeon: Tonny Bollman, MD;  Location: [facility] INVASIVE CV LAB;  Service: Cardiovascular;  Laterality: N/A;  . CORONARY STENT INTERVENTION N/A 11/05/2016   Procedure: CORONARY STENT INTERVENTION;  Surgeon: Marykay Lex, MD;  Location: [facility] INVASIVE CV LAB;  Service: Cardiovascular;  Laterality: N/A;  . LEFT HEART CATH AND CORONARY ANGIOGRAPHY N/A 11/03/2016   Procedure: LEFT HEART CATH AND CORONARY ANGIOGRAPHY;  Surgeon: Tonny Bollman, MD;  Location: [facility] INVASIVE CV LAB;  Service: Cardiovascular;  Laterality: N/A;    Current Medications: Current Meds  Medication Sig  . amLODipine (NORVASC) 10 MG  tablet Take 1 tablet (10 mg total) by mouth daily.  Marland Kitchen aspirin EC 81 MG tablet Take 81 mg by mouth daily.  Marland Kitchen atorvastatin (LIPITOR) 80 MG tablet Take 1 tablet (80 mg total) by mouth daily at 6 PM.  . lisinopril (PRINIVIL,ZESTRIL) 5 MG tablet Take 1 tablet (5 mg total) by mouth daily.  . metoprolol succinate (TOPROL XL) 25 MG 24 hr tablet Take 0.5 tablets (12.5 mg total) by mouth daily.  . nitroGLYCERIN (NITROSTAT) 0.4 MG SL tablet Place 1 tablet (0.4 mg total) under the tongue every 5 (five) minutes as needed for chest pain.  . [DISCONTINUED] amLODipine (NORVASC) 10 MG tablet Take 1 tablet (10 mg total) by mouth daily.  . [DISCONTINUED] lisinopril (PRINIVIL,ZESTRIL) 5 MG tablet Take 1 tablet (5 mg total) by mouth daily.  . [DISCONTINUED] metoprolol succinate (TOPROL XL) 25 MG 24 hr tablet Take 0.5 tablets (12.5 mg total) by mouth daily.  . [DISCONTINUED] ticagrelor (BRILINTA) 90 MG TABS tablet Take 1 tablet (90 mg total) by mouth 2 (two) times daily.     Allergies:   Patient has no known allergies.   Social History   Socioeconomic History  . Marital status: Married    Spouse name: Not on file  . Number of children: Not on file  . Years of education: Not on file  . Highest education level: Not on file  Occupational History  . Occupation: Professor    Associate Professor: [employer]  Social Needs  . Financial resource strain: Not on file  . Food insecurity:  Worry: Not on file    Inability: Not on file  . Transportation needs:    Medical: Not on file    Non-medical: Not on file  Tobacco Use  . Smoking status: Never Smoker  . Smokeless tobacco: Never Used  Substance and Sexual Activity  . Alcohol use: Yes    Comment: Occasionally  . Drug use: Not on file  . Sexual activity: Not on file  Lifestyle  . Physical activity:    Days per week: Not on file    Minutes per session: Not on file  . Stress: Not on file  Relationships  . Social connections:    Talks on phone: Not on file    Gets  together: Not on file    Attends religious service: Not on file    Active member of club or organization: Not on file    Attends meetings of clubs or organizations: Not on file    Relationship status: Not on file  Other Topics Concern  . Not on file  Social History Narrative   Originally from [place]   Professor at [employer]   Wife is Family Physician with [employer]      Family History: The patient's Family history is unknown by patient.  ROS:   Please see the history of present illness.    All other systems reviewed and are negative.  EKGs/Labs/Other Studies Reviewed:    EKG:  EKG is not ordered today.    Recent Labs: No results found for requested labs within last 8760 hours.  Recent Lipid Panel    Component Value Date/Time   CHOL 96 (L) 12/31/2016 0947   TRIG 49 12/31/2016 0947   HDL 40 12/31/2016 0947   CHOLHDL 2.4 12/31/2016 0947   CHOLHDL 4.2 11/05/2016 0250   VLDL 19 11/05/2016 0250   LDLCALC 46 12/31/2016 0947    Physical Exam:    VS:  BP 138/86   Pulse 61   Ht 5\' 8"  (1.727 m)   Wt 186 lb 3.2 oz (84.5 kg)   SpO2 98%   BMI 28.31 kg/m     Wt Readings from Last 3 Encounters:  05/04/18 186 lb 3.2 oz (84.5 kg)  07/11/17 177 lb (80.3 kg)  01/27/17 186 lb 6.4 oz (84.6 kg)     GEN:  Well nourished, well developed in no acute distress HEENT: Normal NECK: No JVD; No carotid bruits LYMPHATICS: No lymphadenopathy CARDIAC: RRR, no murmurs, rubs, gallops RESPIRATORY:  Clear to auscultation without rales, wheezing or rhonchi  ABDOMEN: Soft, non-tender, non-distended MUSCULOSKELETAL:  No edema; No deformity  SKIN: Warm and dry NEUROLOGIC:  Alert and oriented x 3 PSYCHIATRIC:  Normal affect   ASSESSMENT:    1. Mixed hyperlipidemia   2. Essential hypertension   3. Coronary artery disease involving native coronary artery of native heart without angina pectoris    PLAN:    In order of problems listed above:  1. Most recent lipids are reviewed from January 2020  with a cholesterol 121, HDL 51, and LDL 58.  The patient will continue on atorvastatin 80 mg daily.  He is following a prudent lifestyle and exercise program. 2. Blood pressure is controlled on a combination of amlodipine, lisinopril, and metoprolol succinate. 3. The patient appears to be stable without symptoms of angina.  He will continue his risk reduction program as above.  I have recommended he continue aspirin 81 mg lifelong.  He is greater than 12 months out from PCI and can discontinue  Brilinta at this point.  We discussed pros and cons weighing bleeding risk and antithrombotic protection of dual antiplatelet therapy.   Medication Adjustments/Labs and Tests Ordered: Current medicines are reviewed at length with the patient today.  Concerns regarding medicines are outlined above.  No orders of the defined types were placed in this encounter.  Meds ordered this encounter  Medications  . metoprolol succinate (TOPROL XL) 25 MG 24 hr tablet    Sig: Take 0.5 tablets (12.5 mg total) by mouth daily.    Dispense:  90 tablet    Refill:  3    PT TRAVELING OUT OF THE COUNTRY; WILL NEED REFILLS SOONER  . lisinopril (PRINIVIL,ZESTRIL) 5 MG tablet    Sig: Take 1 tablet (5 mg total) by mouth daily.    Dispense:  90 tablet    Refill:  3    PT TRAVELING OUT OF THE COUNTRY; WILL NEED REFILLS SOONER  . amLODipine (NORVASC) 10 MG tablet    Sig: Take 1 tablet (10 mg total) by mouth daily.    Dispense:  90 tablet    Refill:  3    PT TRAVELING OUT OF THE COUNTRY; WILL NEED REFILLS SOONER    Patient Instructions  Medication Instructions:  1) STOP BRILINTA  Labwork: None  Testing/Procedures: None  Follow-Up: Your provider wants you to follow-up in: 1 year with Dr. Excell Seltzer. You will receive a reminder letter in the mail two months in advance. If you don't receive a letter, please call our office to schedule the follow-up appointment.    Any Other Special Instructions Will Be Listed Below (If  Applicable).     If you need a refill on your cardiac medications before your next appointment, please call your pharmacy.      Signed, Tonny Bollman, MD  05/04/2018 10:51 AM    [facility]

## 2018-09-25 ENCOUNTER — Other Ambulatory Visit: Payer: Self-pay | Admitting: Physician Assistant

## 2019-01-04 DIAGNOSIS — I10 Essential (primary) hypertension: Secondary | ICD-10-CM | POA: Diagnosis not present

## 2019-05-05 DIAGNOSIS — I214 Non-ST elevation (NSTEMI) myocardial infarction: Secondary | ICD-10-CM | POA: Diagnosis not present

## 2019-05-05 DIAGNOSIS — E782 Mixed hyperlipidemia: Secondary | ICD-10-CM | POA: Diagnosis not present

## 2019-05-05 DIAGNOSIS — I10 Essential (primary) hypertension: Secondary | ICD-10-CM | POA: Diagnosis not present

## 2019-05-05 DIAGNOSIS — R7301 Impaired fasting glucose: Secondary | ICD-10-CM | POA: Diagnosis not present

## 2019-05-05 DIAGNOSIS — I129 Hypertensive chronic kidney disease with stage 1 through stage 4 chronic kidney disease, or unspecified chronic kidney disease: Secondary | ICD-10-CM | POA: Diagnosis not present

## 2019-05-06 ENCOUNTER — Ambulatory Visit
Admission: RE | Admit: 2019-05-06 | Discharge: 2019-05-06 | Disposition: A | Discharge status: Home / Self Care | Payer: 59 | Source: Ambulatory Visit | Attending: Family Medicine | Admitting: Family Medicine

## 2019-05-06 ENCOUNTER — Other Ambulatory Visit: Payer: Self-pay | Admitting: Family Medicine

## 2019-05-06 DIAGNOSIS — M19012 Primary osteoarthritis, left shoulder: Secondary | ICD-10-CM | POA: Diagnosis not present

## 2019-05-06 DIAGNOSIS — M25612 Stiffness of left shoulder, not elsewhere classified: Secondary | ICD-10-CM

## 2019-05-25 DIAGNOSIS — M19072 Primary osteoarthritis, left ankle and foot: Secondary | ICD-10-CM | POA: Diagnosis not present

## 2019-06-02 DIAGNOSIS — H9011 Conductive hearing loss, unilateral, right ear, with unrestricted hearing on the contralateral side: Secondary | ICD-10-CM | POA: Diagnosis not present

## 2019-06-02 DIAGNOSIS — H9313 Tinnitus, bilateral: Secondary | ICD-10-CM | POA: Diagnosis not present

## 2019-07-05 ENCOUNTER — Other Ambulatory Visit: Payer: Self-pay | Admitting: Physician Assistant

## 2019-07-06 ENCOUNTER — Other Ambulatory Visit: Payer: Self-pay | Admitting: Cardiovascular Disease

## 2019-08-26 DIAGNOSIS — I10 Essential (primary) hypertension: Secondary | ICD-10-CM | POA: Diagnosis not present

## 2019-08-26 DIAGNOSIS — E1169 Type 2 diabetes mellitus with other specified complication: Secondary | ICD-10-CM | POA: Diagnosis not present

## 2019-08-26 DIAGNOSIS — E785 Hyperlipidemia, unspecified: Secondary | ICD-10-CM | POA: Diagnosis not present

## 2019-09-14 ENCOUNTER — Other Ambulatory Visit: Payer: Self-pay | Admitting: Physician Assistant

## 2019-09-16 ENCOUNTER — Other Ambulatory Visit: Payer: Self-pay | Admitting: Cardiovascular Disease

## 2019-09-27 DIAGNOSIS — E1169 Type 2 diabetes mellitus with other specified complication: Secondary | ICD-10-CM | POA: Diagnosis not present

## 2019-09-27 DIAGNOSIS — I129 Hypertensive chronic kidney disease with stage 1 through stage 4 chronic kidney disease, or unspecified chronic kidney disease: Secondary | ICD-10-CM | POA: Diagnosis not present

## 2019-09-27 DIAGNOSIS — E785 Hyperlipidemia, unspecified: Secondary | ICD-10-CM | POA: Diagnosis not present

## 2019-09-27 DIAGNOSIS — I10 Essential (primary) hypertension: Secondary | ICD-10-CM | POA: Diagnosis not present

## 2019-10-11 ENCOUNTER — Other Ambulatory Visit: Payer: Self-pay | Admitting: Cardiovascular Disease

## 2019-10-21 ENCOUNTER — Other Ambulatory Visit: Payer: Self-pay | Admitting: Cardiovascular Disease

## 2019-11-22 ENCOUNTER — Other Ambulatory Visit: Payer: Self-pay | Admitting: Cardiovascular Disease

## 2019-11-30 ENCOUNTER — Other Ambulatory Visit: Payer: Self-pay | Admitting: Physician Assistant

## 2019-11-30 NOTE — Telephone Encounter (Signed)
I have not seen this pt in 2 years. Please call him to see if he will be following up here. If so, ok to give 1 month refill and make sure he schedules follow up. Tereso Newcomer, PA-C    11/30/2019 4:43 PM

## 2019-12-01 NOTE — Telephone Encounter (Signed)
I called and left patient a message to call back and set up an appointment for refills. Patient has not been seen since 04/2018.

## 2020-12-10 IMAGING — DX DG HUMERUS 2V *L*
3 series · 3 of 3 positions shown · non-contrast
Comparison: Chest x-ray 11/03/2016.

CLINICAL DATA: Decreased range of motion left shoulder.

EXAM:
LEFT HUMERUS - 2+ VIEW

[dg humerus left (1 of 3)]
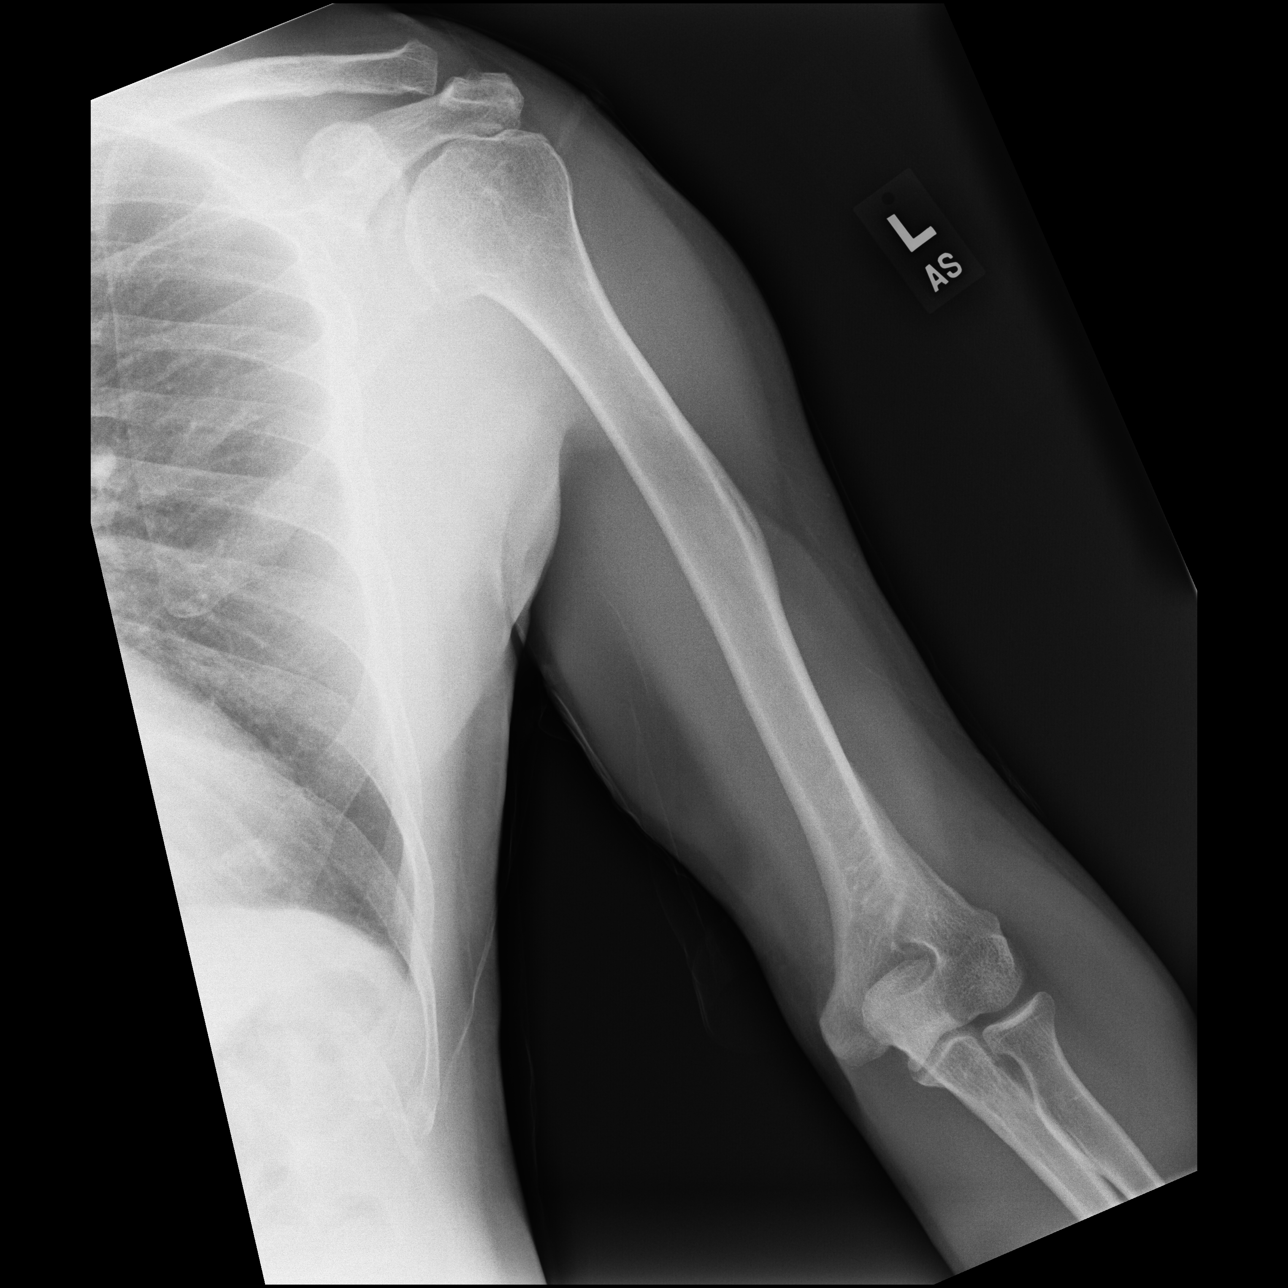

[dg humerus left (2 of 3)]
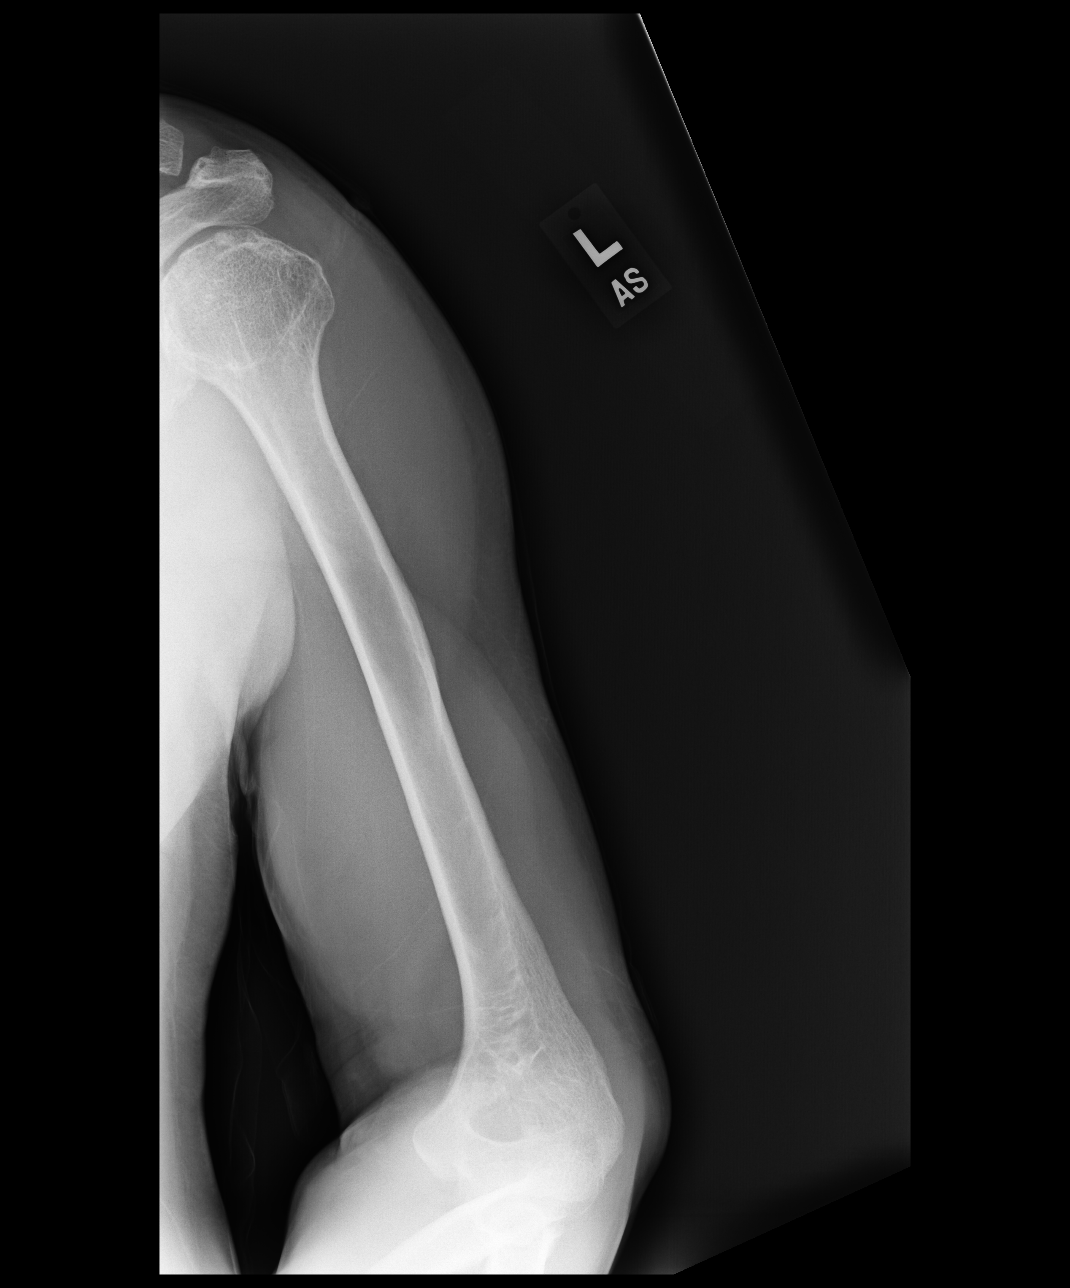

[dg humerus left (3 of 3)]
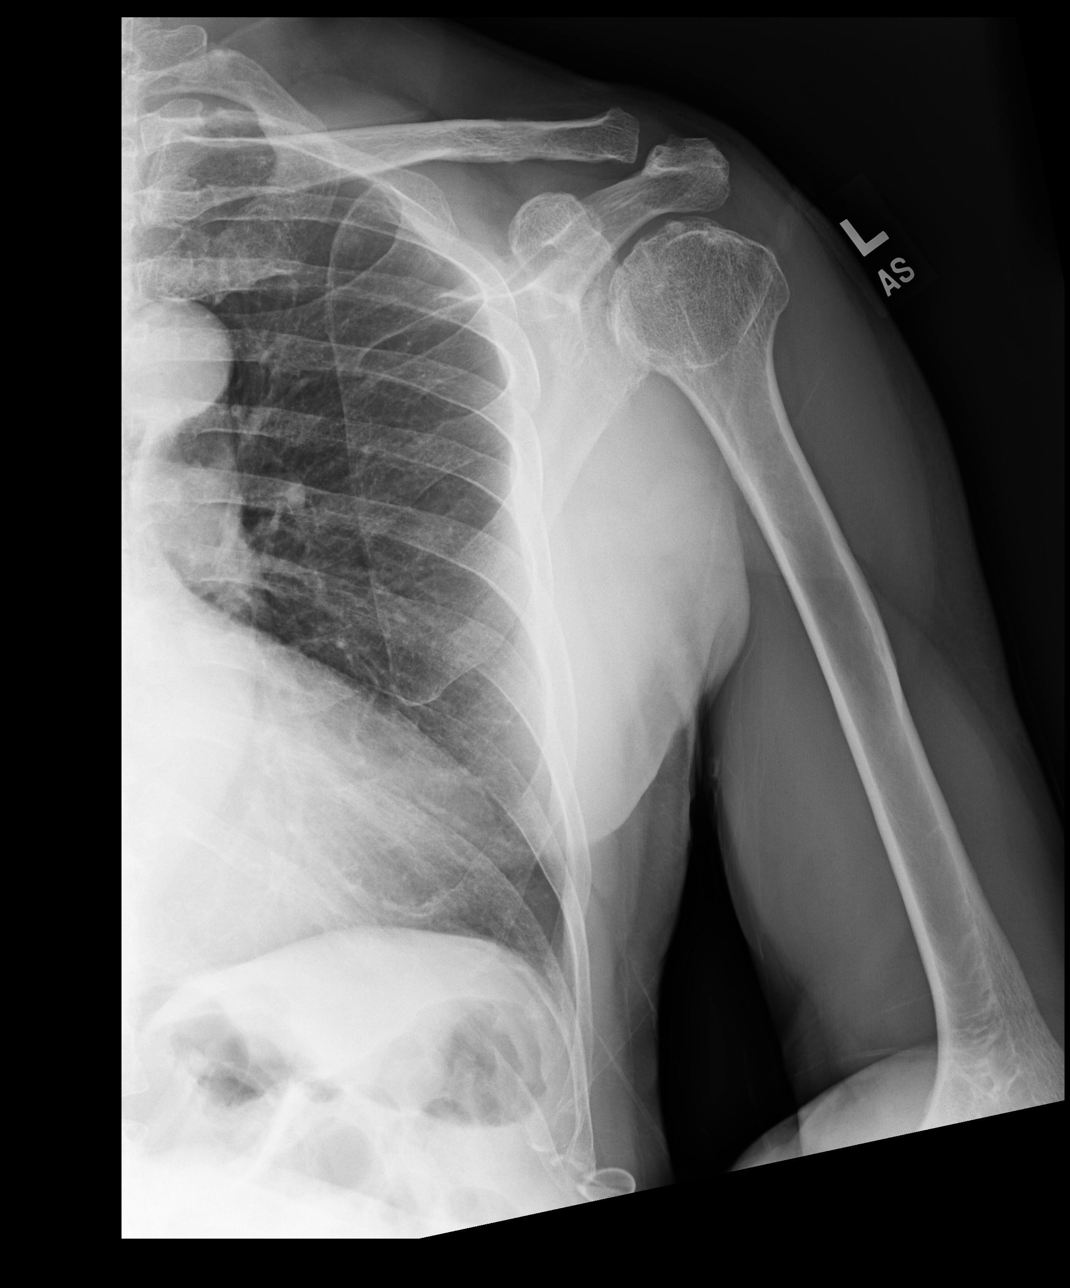

[3 of 3 positions shown; findings below may reference images not displayed]

FINDINGS: Acromioclavicular and glenohumeral degenerative change. Loose body
within the glenohumeral joint cannot be excluded. Calcific density
noted over the region of the supraspinatus tendon, calcific
supraspinatus tendinosis cannot be excluded. No acute bony or joint
abnormality identified. No evidence of fracture, dislocation, or
separation about the left shoulder. Left humerus is intact.
IMPRESSION: 1.  No acute abnormality.  Left humerus is intact.

2. Acromioclavicular and glenohumeral degenerative change. Loose
body within the glenohumeral joint cannot be excluded. Calcific
density noted over the region of supraspinatus tendon, calcific
supraspinatus tendinosis cannot be excluded.
